# Patient Record
Sex: Female | Born: 1940 | Race: White | Hispanic: No | Smoking: Current every day smoker
Health system: Southern US, Community
[De-identification: ages and names within clinical notes are randomized; demographics above are authoritative.]

## PROBLEM LIST (undated history)

## (undated) DIAGNOSIS — J019 Acute sinusitis, unspecified: Secondary | ICD-10-CM

## (undated) DIAGNOSIS — E669 Obesity, unspecified: Secondary | ICD-10-CM

## (undated) DIAGNOSIS — J449 Chronic obstructive pulmonary disease, unspecified: Secondary | ICD-10-CM

## (undated) DIAGNOSIS — M1711 Unilateral primary osteoarthritis, right knee: Secondary | ICD-10-CM

## (undated) DIAGNOSIS — R918 Other nonspecific abnormal finding of lung field: Secondary | ICD-10-CM

## (undated) DIAGNOSIS — J309 Allergic rhinitis, unspecified: Secondary | ICD-10-CM

## (undated) DIAGNOSIS — Z720 Tobacco use: Secondary | ICD-10-CM

## (undated) DIAGNOSIS — I739 Peripheral vascular disease, unspecified: Secondary | ICD-10-CM

## (undated) DIAGNOSIS — R0600 Dyspnea, unspecified: Secondary | ICD-10-CM

## (undated) DIAGNOSIS — E119 Type 2 diabetes mellitus without complications: Secondary | ICD-10-CM

## (undated) DIAGNOSIS — I509 Heart failure, unspecified: Secondary | ICD-10-CM

## (undated) DIAGNOSIS — M539 Dorsopathy, unspecified: Secondary | ICD-10-CM

## (undated) DIAGNOSIS — E782 Mixed hyperlipidemia: Secondary | ICD-10-CM

## (undated) HISTORY — PX: OTHER SURGICAL HISTORY: SHX169

## (undated) HISTORY — DX: Heart failure, unspecified: I50.9

## (undated) HISTORY — DX: Obesity, unspecified: E66.9

## (undated) HISTORY — DX: Chronic obstructive pulmonary disease, unspecified: J44.9

## (undated) HISTORY — PX: CHOLECYSTECTOMY: SHX55

## (undated) HISTORY — PX: BACK SURGERY: SHX140

## (undated) HISTORY — PX: TONSILLECTOMY: SUR1361

## (undated) HISTORY — PX: EYE SURGERY: SHX253

## (undated) HISTORY — PX: ABDOMINAL HYSTERECTOMY: SHX81

## (undated) HISTORY — PX: NECK SURGERY: SHX720

## (undated) HISTORY — PX: APPENDECTOMY: SHX54

## (undated) HISTORY — DX: Allergic rhinitis, unspecified: J30.9

## (undated) HISTORY — DX: Peripheral vascular disease, unspecified: I73.9

---

## 2002-01-01 ENCOUNTER — Inpatient Hospital Stay (HOSPITAL_COMMUNITY): Admission: EM | Admit: 2002-01-01 | Discharge: 2002-01-08 | Payer: Self-pay | Admitting: Emergency Medicine

## 2002-01-01 ENCOUNTER — Encounter: Payer: Self-pay | Admitting: Emergency Medicine

## 2002-01-04 ENCOUNTER — Encounter: Payer: Self-pay | Admitting: Internal Medicine

## 2014-12-25 DIAGNOSIS — J449 Chronic obstructive pulmonary disease, unspecified: Secondary | ICD-10-CM | POA: Diagnosis not present

## 2014-12-25 DIAGNOSIS — Z720 Tobacco use: Secondary | ICD-10-CM | POA: Diagnosis not present

## 2014-12-25 DIAGNOSIS — L989 Disorder of the skin and subcutaneous tissue, unspecified: Secondary | ICD-10-CM | POA: Diagnosis not present

## 2014-12-25 DIAGNOSIS — I509 Heart failure, unspecified: Secondary | ICD-10-CM | POA: Diagnosis not present

## 2015-01-08 DIAGNOSIS — Z0000 Encounter for general adult medical examination without abnormal findings: Secondary | ICD-10-CM | POA: Diagnosis not present

## 2015-01-08 DIAGNOSIS — J209 Acute bronchitis, unspecified: Secondary | ICD-10-CM | POA: Diagnosis not present

## 2015-01-14 DIAGNOSIS — J449 Chronic obstructive pulmonary disease, unspecified: Secondary | ICD-10-CM | POA: Diagnosis not present

## 2015-02-17 DIAGNOSIS — R509 Fever, unspecified: Secondary | ICD-10-CM | POA: Diagnosis not present

## 2015-03-03 DIAGNOSIS — J449 Chronic obstructive pulmonary disease, unspecified: Secondary | ICD-10-CM | POA: Diagnosis not present

## 2015-03-05 DIAGNOSIS — M1990 Unspecified osteoarthritis, unspecified site: Secondary | ICD-10-CM | POA: Diagnosis not present

## 2015-03-05 DIAGNOSIS — M539 Dorsopathy, unspecified: Secondary | ICD-10-CM | POA: Diagnosis not present

## 2015-03-05 DIAGNOSIS — R0602 Shortness of breath: Secondary | ICD-10-CM | POA: Diagnosis not present

## 2015-03-05 DIAGNOSIS — M25552 Pain in left hip: Secondary | ICD-10-CM | POA: Diagnosis not present

## 2015-03-11 DIAGNOSIS — R0602 Shortness of breath: Secondary | ICD-10-CM | POA: Diagnosis not present

## 2015-04-03 DIAGNOSIS — R5383 Other fatigue: Secondary | ICD-10-CM | POA: Diagnosis not present

## 2015-04-03 DIAGNOSIS — E785 Hyperlipidemia, unspecified: Secondary | ICD-10-CM | POA: Diagnosis not present

## 2015-04-03 DIAGNOSIS — I509 Heart failure, unspecified: Secondary | ICD-10-CM | POA: Diagnosis not present

## 2015-04-03 DIAGNOSIS — K219 Gastro-esophageal reflux disease without esophagitis: Secondary | ICD-10-CM | POA: Diagnosis not present

## 2015-04-03 DIAGNOSIS — Z79899 Other long term (current) drug therapy: Secondary | ICD-10-CM | POA: Diagnosis not present

## 2015-04-03 DIAGNOSIS — M1990 Unspecified osteoarthritis, unspecified site: Secondary | ICD-10-CM | POA: Diagnosis not present

## 2015-04-09 DIAGNOSIS — J449 Chronic obstructive pulmonary disease, unspecified: Secondary | ICD-10-CM | POA: Diagnosis not present

## 2015-04-10 DIAGNOSIS — D510 Vitamin B12 deficiency anemia due to intrinsic factor deficiency: Secondary | ICD-10-CM | POA: Diagnosis not present

## 2015-04-10 DIAGNOSIS — J449 Chronic obstructive pulmonary disease, unspecified: Secondary | ICD-10-CM | POA: Diagnosis not present

## 2015-04-17 DIAGNOSIS — D510 Vitamin B12 deficiency anemia due to intrinsic factor deficiency: Secondary | ICD-10-CM | POA: Diagnosis not present

## 2015-04-23 DIAGNOSIS — M1711 Unilateral primary osteoarthritis, right knee: Secondary | ICD-10-CM | POA: Diagnosis not present

## 2015-05-05 DIAGNOSIS — D510 Vitamin B12 deficiency anemia due to intrinsic factor deficiency: Secondary | ICD-10-CM | POA: Diagnosis not present

## 2015-05-05 DIAGNOSIS — M539 Dorsopathy, unspecified: Secondary | ICD-10-CM | POA: Diagnosis not present

## 2015-05-05 DIAGNOSIS — J302 Other seasonal allergic rhinitis: Secondary | ICD-10-CM | POA: Diagnosis not present

## 2015-05-08 DIAGNOSIS — F17200 Nicotine dependence, unspecified, uncomplicated: Secondary | ICD-10-CM | POA: Diagnosis not present

## 2015-05-08 DIAGNOSIS — R0789 Other chest pain: Secondary | ICD-10-CM | POA: Diagnosis not present

## 2015-05-08 DIAGNOSIS — I509 Heart failure, unspecified: Secondary | ICD-10-CM | POA: Diagnosis not present

## 2015-05-08 DIAGNOSIS — R0602 Shortness of breath: Secondary | ICD-10-CM | POA: Diagnosis not present

## 2015-05-12 DIAGNOSIS — J449 Chronic obstructive pulmonary disease, unspecified: Secondary | ICD-10-CM | POA: Diagnosis not present

## 2015-05-13 DIAGNOSIS — R0602 Shortness of breath: Secondary | ICD-10-CM | POA: Diagnosis not present

## 2015-05-13 DIAGNOSIS — I509 Heart failure, unspecified: Secondary | ICD-10-CM | POA: Diagnosis not present

## 2015-05-13 DIAGNOSIS — R0789 Other chest pain: Secondary | ICD-10-CM | POA: Diagnosis not present

## 2015-05-23 DIAGNOSIS — D510 Vitamin B12 deficiency anemia due to intrinsic factor deficiency: Secondary | ICD-10-CM | POA: Diagnosis not present

## 2015-05-23 DIAGNOSIS — M539 Dorsopathy, unspecified: Secondary | ICD-10-CM | POA: Diagnosis not present

## 2015-05-23 DIAGNOSIS — Z720 Tobacco use: Secondary | ICD-10-CM | POA: Diagnosis not present

## 2015-08-11 DIAGNOSIS — J449 Chronic obstructive pulmonary disease, unspecified: Secondary | ICD-10-CM | POA: Diagnosis not present

## 2015-09-05 DIAGNOSIS — L989 Disorder of the skin and subcutaneous tissue, unspecified: Secondary | ICD-10-CM | POA: Diagnosis not present

## 2015-11-03 DIAGNOSIS — Z79899 Other long term (current) drug therapy: Secondary | ICD-10-CM | POA: Diagnosis not present

## 2015-11-03 DIAGNOSIS — M1990 Unspecified osteoarthritis, unspecified site: Secondary | ICD-10-CM | POA: Diagnosis not present

## 2015-11-03 DIAGNOSIS — Z720 Tobacco use: Secondary | ICD-10-CM | POA: Diagnosis not present

## 2015-11-03 DIAGNOSIS — J329 Chronic sinusitis, unspecified: Secondary | ICD-10-CM | POA: Diagnosis not present

## 2015-11-03 DIAGNOSIS — Z0000 Encounter for general adult medical examination without abnormal findings: Secondary | ICD-10-CM | POA: Diagnosis not present

## 2015-12-10 DIAGNOSIS — J209 Acute bronchitis, unspecified: Secondary | ICD-10-CM | POA: Diagnosis not present

## 2015-12-10 DIAGNOSIS — J441 Chronic obstructive pulmonary disease with (acute) exacerbation: Secondary | ICD-10-CM | POA: Diagnosis not present

## 2016-02-03 DIAGNOSIS — J189 Pneumonia, unspecified organism: Secondary | ICD-10-CM | POA: Diagnosis not present

## 2016-02-03 DIAGNOSIS — Z1389 Encounter for screening for other disorder: Secondary | ICD-10-CM | POA: Diagnosis not present

## 2016-02-03 DIAGNOSIS — Z0000 Encounter for general adult medical examination without abnormal findings: Secondary | ICD-10-CM | POA: Diagnosis not present

## 2016-02-03 DIAGNOSIS — Z79899 Other long term (current) drug therapy: Secondary | ICD-10-CM | POA: Diagnosis not present

## 2016-02-03 DIAGNOSIS — J449 Chronic obstructive pulmonary disease, unspecified: Secondary | ICD-10-CM | POA: Diagnosis not present

## 2016-02-03 DIAGNOSIS — D510 Vitamin B12 deficiency anemia due to intrinsic factor deficiency: Secondary | ICD-10-CM | POA: Diagnosis not present

## 2016-02-27 ENCOUNTER — Ambulatory Visit (INDEPENDENT_AMBULATORY_CARE_PROVIDER_SITE_OTHER)
Admission: RE | Admit: 2016-02-27 | Discharge: 2016-02-27 | Disposition: A | Discharge status: Home / Self Care | Payer: Medicare Other | Source: Ambulatory Visit | Attending: Pulmonary Disease | Admitting: Pulmonary Disease

## 2016-02-27 ENCOUNTER — Ambulatory Visit (INDEPENDENT_AMBULATORY_CARE_PROVIDER_SITE_OTHER): Payer: Medicare Other | Admitting: Pulmonary Disease

## 2016-02-27 ENCOUNTER — Encounter: Payer: Self-pay | Admitting: Pulmonary Disease

## 2016-02-27 VITALS — BP 138/72 | HR 84 | Ht 65.5 in | Wt 219.4 lb

## 2016-02-27 DIAGNOSIS — J449 Chronic obstructive pulmonary disease, unspecified: Secondary | ICD-10-CM

## 2016-02-27 DIAGNOSIS — F17210 Nicotine dependence, cigarettes, uncomplicated: Secondary | ICD-10-CM

## 2016-02-27 NOTE — Progress Notes (Addendum)
Jeanette Simpson    [id number]    10-22-1940  Primary Care Physician:Simpson Jeanette Hart., MD  Referring Physician: Noni Saupe, MD [address] [place], [place] [address]  Chief complaint: Consult for evaluation of COPD GOLD D  HPI: [name] is a 76 year old with past medical history of COPD GOLD D (CAT score 10, 3-4 exacerbations every year). She was evaluated in [facility] in 2014 for severe COPD, active smoking and she was on Symbicort, PRN inhalers and DuoNeb's.  She reports 3-4 exacerbations every year. She was seen at her primary care office in mid January for another exacerbation, respiratory tract infection. She was treated with Levaquin and medication with improvement in symptoms. She reportedly had a chest x-ray at that time which showed an abnormality. She was told to follow-up at pulmonary for consideration of CT scan. I do not have the image or report to review.   She has chronic dyspnea on exertion, cough with sputum production. She denies any fevers, chills, hemoptysis, chest pain, palpitation. She has a 30-pack-year smoking history and continues to smoke half pack per day.  Outpatient Encounter Prescriptions as of 02/27/2016  Medication Sig  . benzonatate (TESSALON) 200 MG capsule TK 1 C PO TID PRF COUGH  . budesonide-formoterol (SYMBICORT) 160-4.5 MCG/ACT inhaler Inhale 2 puffs into the lungs 2 (two) times daily.  . DULoxetine (CYMBALTA) 60 MG capsule   . ipratropium-albuterol (DUONEB) 0.5-2.5 (3) MG/3ML SOLN   . omeprazole (PRILOSEC) 20 MG capsule Take 20 mg by mouth.  . spironolactone (ALDACTONE) 25 MG tablet TK 1 T PO QAM  . traMADol (ULTRAM) 50 MG tablet Take 50 mg by mouth.  . [DISCONTINUED] budesonide (PULMICORT) 0.5 MG/2ML nebulizer solution 0.5 mg.   No facility-administered encounter medications on file as of 02/27/2016.     Allergies as of 02/27/2016 - never reviewed  Allergen Reaction Noted  . Penicillins Hives and Swelling 01/04/2013  .  Codeine Nausea Only 01/04/2013  . Sulfa antibiotics Nausea And Vomiting 01/04/2013    Past Medical History:  Diagnosis Date  . Allergic rhinitis   . CHF (congestive heart failure) (HCC)   . COPD (chronic obstructive pulmonary disease) (HCC)   . Obesity   . PVD (peripheral vascular disease) (HCC)     Past Surgical History:  Procedure Laterality Date  . APPENDECTOMY    . BACK SURGERY    . CHOLECYSTECTOMY    . HYSTERCTOMY    . NECK SURGERY    . TONSILLECTOMY      Family History  Problem Relation Age of Onset  . Heart attack Mother   . Cancer Father     Social History   Social History  . Marital status: Widowed    Spouse name: N/A  . Number of children: N/A  . Years of education: N/A   Occupational History  . Not on file.   Social History Main Topics  . Smoking status: Current Every Day Smoker    Packs/day: 0.50    Years: 60.00    Types: Cigarettes  . Smokeless tobacco: Never Used  . Alcohol use No  . Drug use: No  . Sexual activity: No   Other Topics Concern  . Not on file   Social History Narrative  . No narrative on file    Review of systems: Review of Systems  Constitutional: Negative for fever and chills.  HENT: Negative.   Eyes: Negative for blurred vision.  Respiratory: as per  HPI  Cardiovascular: Negative for chest pain and palpitations.  Gastrointestinal: Negative for vomiting, diarrhea, blood per rectum. Genitourinary: Negative for dysuria, urgency, frequency and hematuria.  Musculoskeletal: Negative for myalgias, back pain and joint pain.  Skin: Negative for itching and rash.  Neurological: Negative for dizziness, tremors, focal weakness, seizures and loss of consciousness.  Endo/Heme/Allergies: Negative for environmental allergies.  Psychiatric/Behavioral: Negative for depression, suicidal ideas and hallucinations.  All other systems reviewed and are negative.  Physical Exam: Blood pressure 138/72, pulse 84, height 5' 5.5" (1.664 m),  weight 219 lb 6.4 oz (99.5 kg), SpO2 92 %. Gen:      No acute distress HEENT:  EOMI, sclera anicteric Neck:     No masses; no thyromegaly Lungs:    Clear to auscultation bilaterally; normal respiratory effort CV:         Regular rate and rhythm; no murmurs Abd:      + bowel sounds; soft, non-tender; no palpable masses, no distension Ext:    No edema; adequate peripheral perfusion Skin:      Warm and dry; no rash Neuro: alert and oriented x 3 Psych: normal mood and affect  Data Reviewed[facility] 40% VC 49% ratio =62% FEF25-75 25% strong bronchodilator response lung volmes TLC 74%, RV 103% DLCO 67% Interpretation: severe obstruction no gas trapping or hyperinflation, mildly reduced diffusion.  Chest x-ray 06/07/04-no acute abnormality. I personally reviewed the image.  Assessment:  COPD GOLD D She appears to have recovered from her recent exacerbation. She continues on Symbicort but reports that she does not use it on a regular basis. I have asked her to optimize her inhalers by using it twice daily. She may need addition of LAMA. We will assess this at next visit. I'll get records from her primary care including recent spirometry.  Abnormal CXR She reportedly had an abnormal chest x-ray at the primary care office last month. I'll try to get these records for review. She'll be scheduled for repeat x-ray  Active smoker She is still smoking half pack per day. Smoking cessation discussed today but she is not ready to quit yet. Time spent counseling-5 minutes  Plan/Recommendations: - Continue symbicort, albuterol PRN - CXR - Records from PCP - Smoking cessation  Chilton GreathousePraveen Auriah Hollings MD [facility] Pager [phone] 02/27/2016, 11:35 AM  CC: Jeanette Saupeedding, Jeanette F. II, MD   Addendum: Reviewed fax from primary care office  Spirometry 04/10/15 FVC 1.43 [53%] FEV1 0.83 [41%] F/F 68 Severe obstructive airway disease  No chest x-ray on record.

## 2016-02-27 NOTE — Patient Instructions (Signed)
Continue using the Symbicort and albuterol as prescribed We will get a chest x-ray today. I'll try to obtain previous records from his primary care physician.  Return to clinic in 1-2 months.

## 2016-04-12 ENCOUNTER — Ambulatory Visit: Payer: Medicare Other | Admitting: Pulmonary Disease

## 2016-06-08 DIAGNOSIS — J449 Chronic obstructive pulmonary disease, unspecified: Secondary | ICD-10-CM | POA: Diagnosis not present

## 2016-06-08 DIAGNOSIS — M1990 Unspecified osteoarthritis, unspecified site: Secondary | ICD-10-CM | POA: Diagnosis not present

## 2016-06-08 DIAGNOSIS — Z720 Tobacco use: Secondary | ICD-10-CM | POA: Diagnosis not present

## 2016-06-09 DIAGNOSIS — M1711 Unilateral primary osteoarthritis, right knee: Secondary | ICD-10-CM | POA: Diagnosis not present

## 2016-10-05 DIAGNOSIS — M1711 Unilateral primary osteoarthritis, right knee: Secondary | ICD-10-CM | POA: Diagnosis not present

## 2016-11-10 DIAGNOSIS — J019 Acute sinusitis, unspecified: Secondary | ICD-10-CM | POA: Diagnosis not present

## 2016-11-10 DIAGNOSIS — Z720 Tobacco use: Secondary | ICD-10-CM | POA: Diagnosis not present

## 2016-11-10 DIAGNOSIS — B9689 Other specified bacterial agents as the cause of diseases classified elsewhere: Secondary | ICD-10-CM | POA: Diagnosis not present

## 2016-12-19 DIAGNOSIS — R069 Unspecified abnormalities of breathing: Secondary | ICD-10-CM | POA: Diagnosis not present

## 2016-12-19 DIAGNOSIS — J441 Chronic obstructive pulmonary disease with (acute) exacerbation: Secondary | ICD-10-CM | POA: Diagnosis not present

## 2016-12-19 DIAGNOSIS — R0602 Shortness of breath: Secondary | ICD-10-CM | POA: Diagnosis not present

## 2016-12-23 DIAGNOSIS — Z720 Tobacco use: Secondary | ICD-10-CM | POA: Diagnosis not present

## 2016-12-23 DIAGNOSIS — J441 Chronic obstructive pulmonary disease with (acute) exacerbation: Secondary | ICD-10-CM | POA: Diagnosis not present

## 2016-12-30 DIAGNOSIS — J449 Chronic obstructive pulmonary disease, unspecified: Secondary | ICD-10-CM | POA: Diagnosis not present

## 2017-01-05 DIAGNOSIS — M1711 Unilateral primary osteoarthritis, right knee: Secondary | ICD-10-CM | POA: Diagnosis not present

## 2017-01-30 DIAGNOSIS — J449 Chronic obstructive pulmonary disease, unspecified: Secondary | ICD-10-CM | POA: Diagnosis not present

## 2017-02-25 DIAGNOSIS — M1990 Unspecified osteoarthritis, unspecified site: Secondary | ICD-10-CM | POA: Diagnosis not present

## 2017-02-25 DIAGNOSIS — Z79899 Other long term (current) drug therapy: Secondary | ICD-10-CM | POA: Diagnosis not present

## 2017-02-25 DIAGNOSIS — J449 Chronic obstructive pulmonary disease, unspecified: Secondary | ICD-10-CM | POA: Diagnosis not present

## 2017-02-25 DIAGNOSIS — I509 Heart failure, unspecified: Secondary | ICD-10-CM | POA: Diagnosis not present

## 2017-02-25 DIAGNOSIS — M539 Dorsopathy, unspecified: Secondary | ICD-10-CM | POA: Diagnosis not present

## 2017-03-02 DIAGNOSIS — J449 Chronic obstructive pulmonary disease, unspecified: Secondary | ICD-10-CM | POA: Diagnosis not present

## 2017-03-30 DIAGNOSIS — J449 Chronic obstructive pulmonary disease, unspecified: Secondary | ICD-10-CM | POA: Diagnosis not present

## 2017-04-05 DIAGNOSIS — M1711 Unilateral primary osteoarthritis, right knee: Secondary | ICD-10-CM | POA: Diagnosis not present

## 2017-04-28 DIAGNOSIS — E119 Type 2 diabetes mellitus without complications: Secondary | ICD-10-CM | POA: Diagnosis not present

## 2017-04-28 DIAGNOSIS — I509 Heart failure, unspecified: Secondary | ICD-10-CM | POA: Diagnosis not present

## 2017-04-28 DIAGNOSIS — J449 Chronic obstructive pulmonary disease, unspecified: Secondary | ICD-10-CM | POA: Diagnosis not present

## 2017-04-28 DIAGNOSIS — R600 Localized edema: Secondary | ICD-10-CM | POA: Diagnosis not present

## 2017-04-28 DIAGNOSIS — L03115 Cellulitis of right lower limb: Secondary | ICD-10-CM | POA: Diagnosis not present

## 2017-04-28 DIAGNOSIS — Z720 Tobacco use: Secondary | ICD-10-CM | POA: Diagnosis not present

## 2017-04-30 DIAGNOSIS — J449 Chronic obstructive pulmonary disease, unspecified: Secondary | ICD-10-CM | POA: Diagnosis not present

## 2017-05-09 DIAGNOSIS — J449 Chronic obstructive pulmonary disease, unspecified: Secondary | ICD-10-CM | POA: Diagnosis not present

## 2017-05-09 DIAGNOSIS — E119 Type 2 diabetes mellitus without complications: Secondary | ICD-10-CM | POA: Diagnosis not present

## 2017-05-09 DIAGNOSIS — E782 Mixed hyperlipidemia: Secondary | ICD-10-CM | POA: Diagnosis not present

## 2017-05-09 DIAGNOSIS — Z0000 Encounter for general adult medical examination without abnormal findings: Secondary | ICD-10-CM | POA: Diagnosis not present

## 2017-05-09 DIAGNOSIS — R600 Localized edema: Secondary | ICD-10-CM | POA: Diagnosis not present

## 2017-05-30 DIAGNOSIS — J449 Chronic obstructive pulmonary disease, unspecified: Secondary | ICD-10-CM | POA: Diagnosis not present

## 2017-06-28 IMAGING — DX DG CHEST 2V
2 series · 2 of 2 positions shown · non-contrast
Comparison: None.

CLINICAL DATA: COPD.

EXAM:
CHEST  2 VIEW

[chest pa]
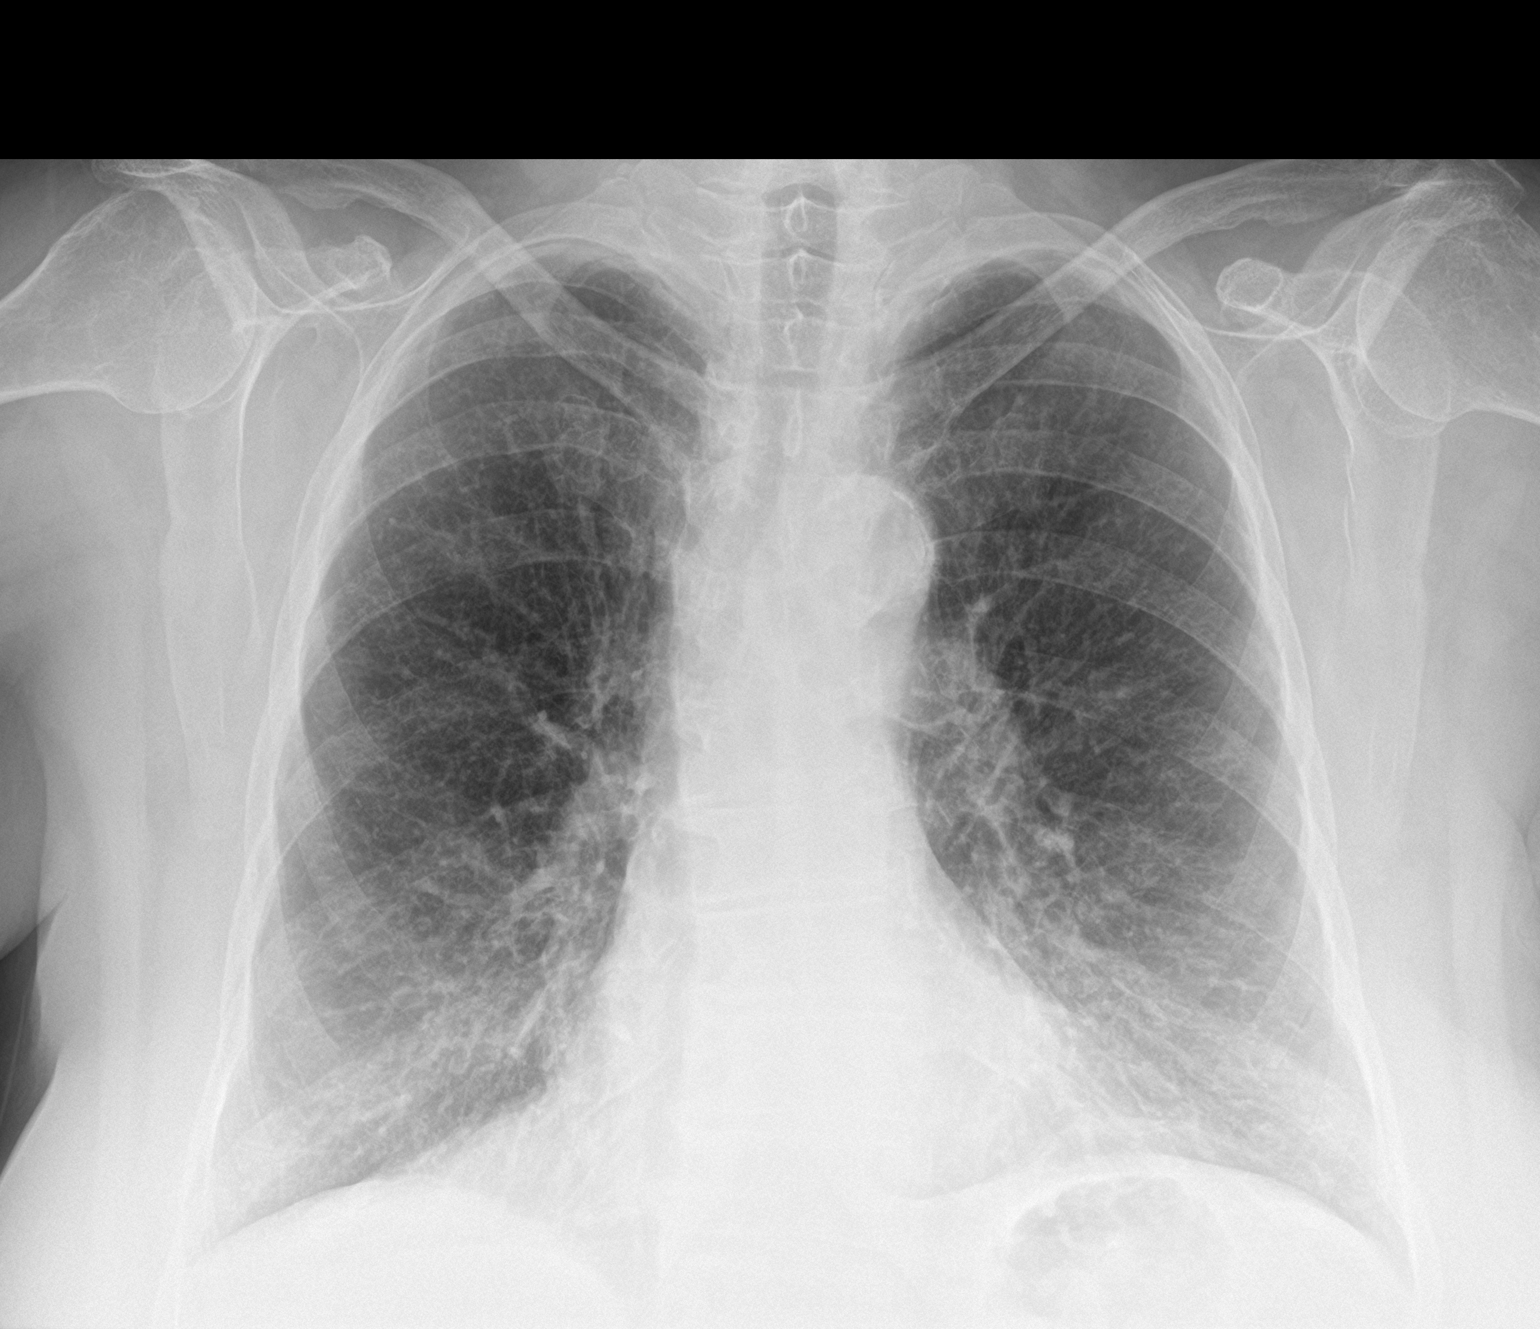

[chest lat]
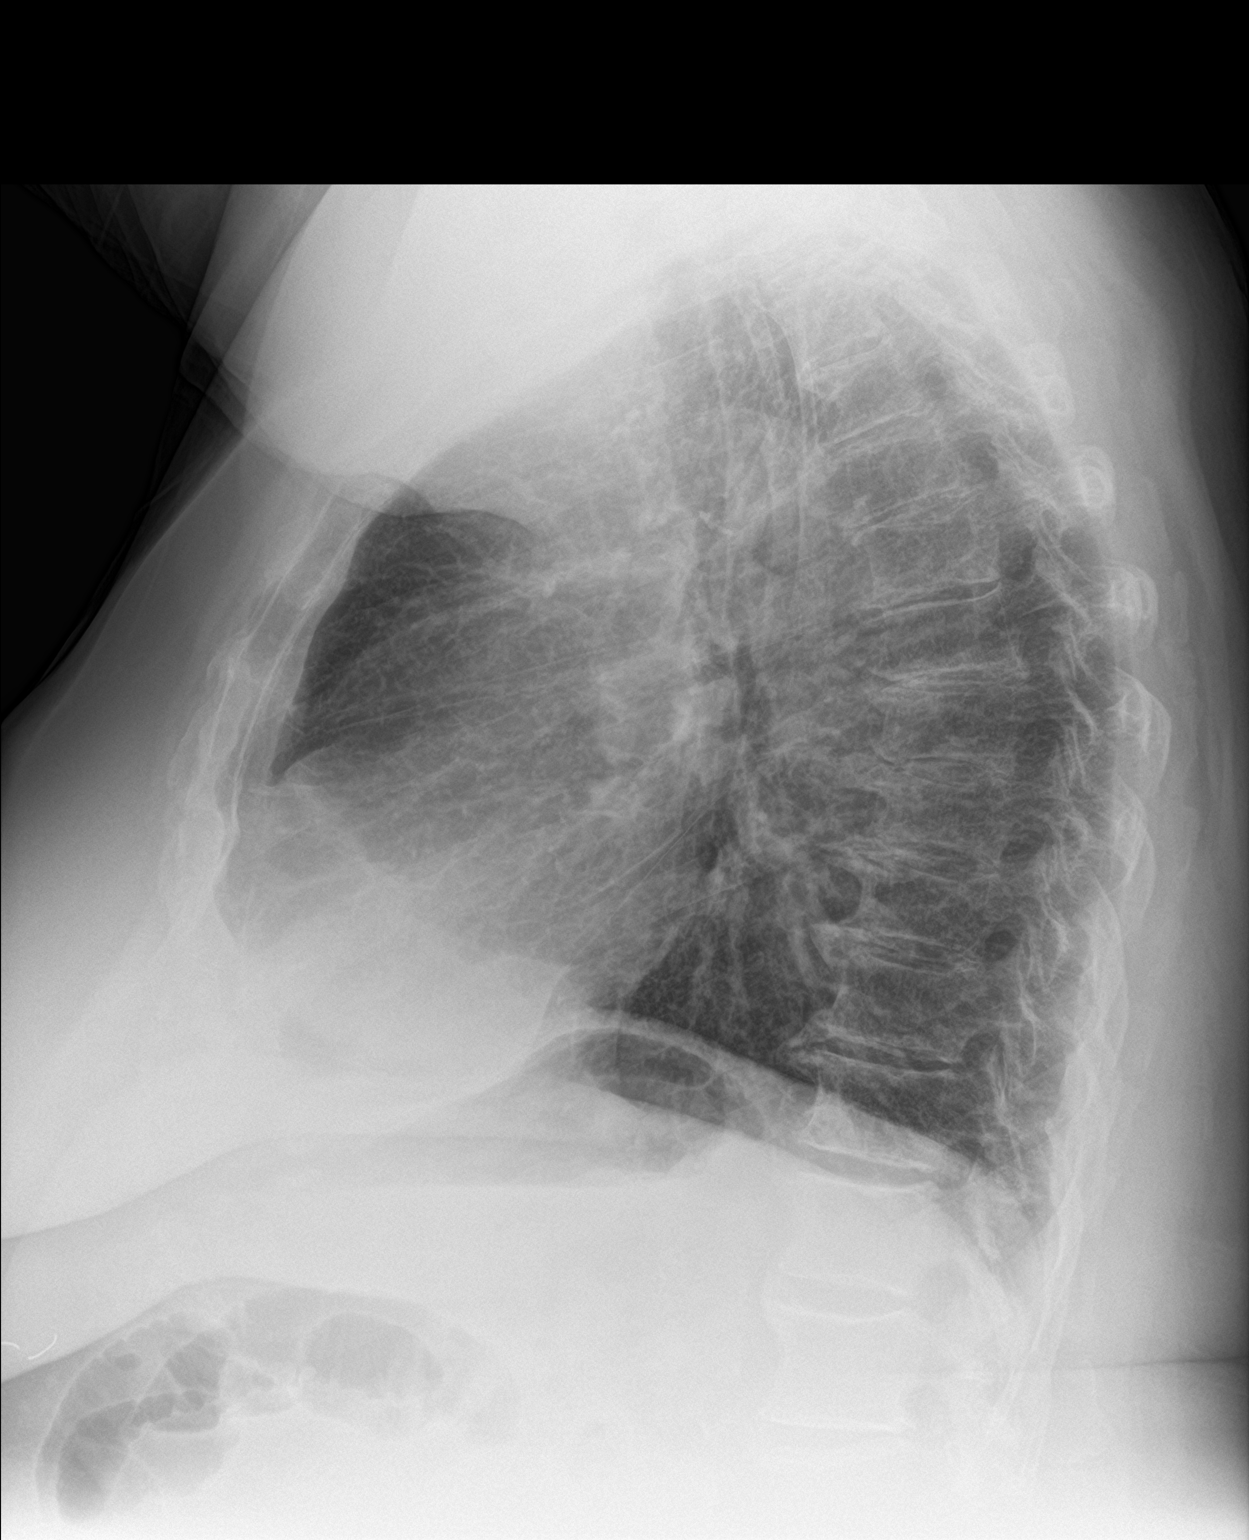

[2 of 2 positions shown; findings below may reference images not displayed]

FINDINGS: There is hyperinflation of the lungs compatible with COPD. Heart and
mediastinal contours are within normal limits. No focal opacities or
effusions. No acute bony abnormality.
IMPRESSION: COPD.  No active disease.

## 2017-06-30 DIAGNOSIS — J449 Chronic obstructive pulmonary disease, unspecified: Secondary | ICD-10-CM | POA: Diagnosis not present

## 2017-07-06 DIAGNOSIS — S5001XA Contusion of right elbow, initial encounter: Secondary | ICD-10-CM | POA: Diagnosis not present

## 2017-07-06 DIAGNOSIS — M1711 Unilateral primary osteoarthritis, right knee: Secondary | ICD-10-CM | POA: Diagnosis not present

## 2017-07-19 DIAGNOSIS — E782 Mixed hyperlipidemia: Secondary | ICD-10-CM | POA: Diagnosis not present

## 2017-07-19 DIAGNOSIS — E119 Type 2 diabetes mellitus without complications: Secondary | ICD-10-CM | POA: Diagnosis not present

## 2017-07-19 DIAGNOSIS — M539 Dorsopathy, unspecified: Secondary | ICD-10-CM | POA: Diagnosis not present

## 2017-07-19 DIAGNOSIS — J449 Chronic obstructive pulmonary disease, unspecified: Secondary | ICD-10-CM | POA: Diagnosis not present

## 2017-07-19 DIAGNOSIS — Z720 Tobacco use: Secondary | ICD-10-CM | POA: Diagnosis not present

## 2017-08-30 ENCOUNTER — Other Ambulatory Visit: Payer: Self-pay

## 2017-08-30 DIAGNOSIS — J449 Chronic obstructive pulmonary disease, unspecified: Secondary | ICD-10-CM | POA: Diagnosis not present

## 2017-08-30 NOTE — Patient Outreach (Signed)
[facility]) Care Management  08/30/2017  Jeanette Simpson [birth year] [id number]   Medication Adherence call to Mrs. Jeanette Simpson spoke with patient she  is due for her pravastatin 40 mg and has already call [facility] and have order this medication. Jeanette Simpson is showing past [facility]   Lillia AbedAna Ollison-Moran CPhT Pharmacy Technician [facility] Care Management Direct Dial [phone]  Fax [phone] [email or web address]

## 2017-09-30 DIAGNOSIS — J449 Chronic obstructive pulmonary disease, unspecified: Secondary | ICD-10-CM | POA: Diagnosis not present

## 2017-10-11 DIAGNOSIS — M1711 Unilateral primary osteoarthritis, right knee: Secondary | ICD-10-CM | POA: Diagnosis not present

## 2017-10-17 ENCOUNTER — Other Ambulatory Visit: Payer: Self-pay

## 2017-10-17 NOTE — Patient Outreach (Signed)
[facility]) Care Management  10/17/2017  WING GFELLER May 02, 1940 [id number]   Medication Adherence call to Mrs. Letitia Libra patient did not answer patient is due on Pravastatin 40 mg. Mrs. Morrow is showing past due under [facility] ins.  Lillia Abed CPhT Pharmacy Technician [facility] Management Direct Dial [phone]  Fax [phone] [email or web address]

## 2017-10-30 DIAGNOSIS — J449 Chronic obstructive pulmonary disease, unspecified: Secondary | ICD-10-CM | POA: Diagnosis not present

## 2017-11-22 DIAGNOSIS — M1711 Unilateral primary osteoarthritis, right knee: Secondary | ICD-10-CM | POA: Diagnosis not present

## 2017-11-30 DIAGNOSIS — J449 Chronic obstructive pulmonary disease, unspecified: Secondary | ICD-10-CM | POA: Diagnosis not present

## 2017-12-31 ENCOUNTER — Other Ambulatory Visit: Payer: Self-pay | Admitting: Emergency Medicine

## 2017-12-31 ENCOUNTER — Emergency Department (HOSPITAL_COMMUNITY): Payer: Medicare Other

## 2017-12-31 ENCOUNTER — Other Ambulatory Visit: Payer: Self-pay

## 2017-12-31 ENCOUNTER — Emergency Department (HOSPITAL_COMMUNITY)
Admission: EM | Admit: 2017-12-31 | Discharge: 2017-12-31 | Disposition: A | Discharge status: Home / Self Care | Payer: Medicare Other | Attending: Emergency Medicine | Admitting: Emergency Medicine

## 2017-12-31 DIAGNOSIS — F17210 Nicotine dependence, cigarettes, uncomplicated: Secondary | ICD-10-CM | POA: Insufficient documentation

## 2017-12-31 DIAGNOSIS — R609 Edema, unspecified: Secondary | ICD-10-CM

## 2017-12-31 DIAGNOSIS — Z79899 Other long term (current) drug therapy: Secondary | ICD-10-CM | POA: Diagnosis not present

## 2017-12-31 DIAGNOSIS — R600 Localized edema: Secondary | ICD-10-CM | POA: Diagnosis not present

## 2017-12-31 DIAGNOSIS — I509 Heart failure, unspecified: Secondary | ICD-10-CM | POA: Diagnosis not present

## 2017-12-31 DIAGNOSIS — J441 Chronic obstructive pulmonary disease with (acute) exacerbation: Secondary | ICD-10-CM | POA: Diagnosis not present

## 2017-12-31 DIAGNOSIS — R05 Cough: Secondary | ICD-10-CM | POA: Diagnosis not present

## 2017-12-31 DIAGNOSIS — R062 Wheezing: Secondary | ICD-10-CM | POA: Diagnosis present

## 2017-12-31 LAB — COMPREHENSIVE METABOLIC PANEL
ALT: 19 U/L (ref 0–44)
ANION GAP: 12 (ref 5–15)
AST: 21 U/L (ref 15–41)
Albumin: 3.3 g/dL — ABNORMAL LOW (ref 3.5–5.0)
Alkaline Phosphatase: 74 U/L (ref 38–126)
BUN: 10 mg/dL (ref 8–23)
CO2: 23 mmol/L (ref 22–32)
Calcium: 9.2 mg/dL (ref 8.9–10.3)
Chloride: 102 mmol/L (ref 98–111)
Creatinine, Ser: 0.62 mg/dL (ref 0.44–1.00)
GFR calc Af Amer: >60 mL/min (ref >60)
GFR calc non Af Amer: >60 mL/min (ref >60)
Glucose, Bld: 118 mg/dL — ABNORMAL HIGH (ref 70–99)
Potassium: 4.1 mmol/L (ref 3.5–5.1)
Sodium: 137 mmol/L (ref 135–145)
TOTAL PROTEIN: 6.7 g/dL (ref 6.5–8.1)
Total Bilirubin: 0.4 mg/dL (ref 0.3–1.2)

## 2017-12-31 LAB — CBC WITH DIFFERENTIAL/PLATELET
Abs Immature Granulocytes: 0.09 10*3/uL — ABNORMAL HIGH (ref 0.00–0.07)
Basophils Absolute: 0.1 10*3/uL (ref 0.0–0.1)
Basophils Relative: 0 %
EOS PCT: 1 %
Eosinophils Absolute: 0.2 10*3/uL (ref 0.0–0.5)
HCT: 46.4 % — ABNORMAL HIGH (ref 36.0–46.0)
Hemoglobin: 13.9 g/dL (ref 12.0–15.0)
Immature Granulocytes: 1 %
Lymphocytes Relative: 19 %
Lymphs Abs: 2.7 10*3/uL (ref 0.7–4.0)
MCH: 27.4 pg (ref 26.0–34.0)
MCHC: 30 g/dL (ref 30.0–36.0)
MCV: 91.5 fL (ref 80.0–100.0)
Monocytes Absolute: 0.8 10*3/uL (ref 0.1–1.0)
Monocytes Relative: 6 %
Neutro Abs: 10.2 10*3/uL — ABNORMAL HIGH (ref 1.7–7.7)
Neutrophils Relative %: 73 %
Platelets: 322 10*3/uL (ref 150–400)
RBC: 5.07 MIL/uL (ref 3.87–5.11)
RDW: 15.7 % — ABNORMAL HIGH (ref 11.5–15.5)
WBC: 14 10*3/uL — ABNORMAL HIGH (ref 4.0–10.5)
nRBC: 0 % (ref 0.0–0.2)

## 2017-12-31 LAB — I-STAT CG4 LACTIC ACID, ED: LACTIC ACID, VENOUS: 1.96 mmol/L — AB (ref 0.5–1.9)

## 2017-12-31 LAB — BRAIN NATRIURETIC PEPTIDE: B Natriuretic Peptide: 68.5 pg/mL (ref 0.0–100.0)

## 2017-12-31 MED ORDER — DOXYCYCLINE HYCLATE 100 MG PO CAPS: 100.0000 mg | ORAL_CAPSULE | Freq: Two times a day (BID) | ORAL | 0 refills | Status: DC

## 2017-12-31 MED ORDER — BENZONATATE 100 MG PO CAPS
200.0000 mg | ORAL_CAPSULE | Freq: Once | ORAL | Status: AC
  Administered 2017-12-31: 200 mg via ORAL
  Filled 2017-12-31: qty 2

## 2017-12-31 MED ORDER — FUROSEMIDE 20 MG PO TABS: 20.0000 mg | ORAL_TABLET | Freq: Every day | ORAL | 0 refills | Status: AC

## 2017-12-31 NOTE — Discharge Instructions (Signed)
Please use doxycycline twice a day for 10 days Lasix 20 mg daily for 10 days Albuterol or DuoNeb treatments every 6 hours as needed for shortness of breath wheezing or coughing Seek medical exam for severe or worsening symptoms Follow-up with your doctor within 3 or 4 days for a recheck without fail

## 2017-12-31 NOTE — ED Notes (Signed)
Patient transported to X-ray 

## 2017-12-31 NOTE — ED Notes (Signed)
Patient verbalizes understanding of discharge instructions. Opportunity for questioning and answers were provided. Armband removed by staff, pt discharged from ED with daughter.  

## 2017-12-31 NOTE — ED Triage Notes (Signed)
Pt presents to ED with abdominal and leg swelling with shob. States she's supposed to take a fluid pill every day but cannot tolerate it so she takes it every other day. Also states she has right knee pain she needs surgery for.

## 2017-12-31 NOTE — ED Provider Notes (Signed)
[facility] EMERGENCY DEPARTMENT Provider Note   CSN: [id number] Arrival date & time: 12/31/17  1235     History   Chief Complaint No chief complaint on file.   HPI Jeanette Simpson is a 77 y.o. female.  HPI  The patient is a 77 year old female, she is known to be treated for COPD, there is also report of congestive heart failure though she denies any cardiac history.  She reports that she was recently diagnosed as a diabetic and has started taking metformin twice a day.  She currently lives by herself and has noted some increasing coughing over the week with some occasional sputum but also complains of bilateral lower extremity swelling.  She denies chest pain, she denies being short of breath and denies any fevers or chills.  Symptoms are persistent, gradually worsening, she is brought in by her daughter, she did not want to come to the hospital but the daughter made her come.  Past Medical History:  Diagnosis Date  . Allergic rhinitis   . CHF (congestive heart failure) (HCC)   . COPD (chronic obstructive pulmonary disease) (HCC)   . Obesity   . PVD (peripheral vascular disease) (HCC)     There are no active problems to display for this patient.  PSH:  Non contributory  SH:  Tobacco use daily   OB History   No obstetric history on file.      Home Medications    Prior to Admission medications   Medication Sig Start Date End Date Taking? Authorizing Provider  benzonatate (TESSALON) 200 MG capsule TK 1 C PO TID PRF COUGH 02/03/16   [provider]  budesonide-formoterol (SYMBICORT) 160-4.5 MCG/ACT inhaler Inhale 2 puffs into the lungs 2 (two) times daily.    [provider]  doxycycline (VIBRAMYCIN) 100 MG capsule Take 1 capsule (100 mg total) by mouth 2 (two) times daily. 12/31/17   Eber Hong, MD  DULoxetine (CYMBALTA) 60 MG capsule  02/20/16   [provider]  furosemide (LASIX) 20 MG tablet Take 1 tablet (20 mg total)  by mouth daily for 10 days. 12/31/17 01/10/18  Eber Hong, MD  ipratropium-albuterol (DUONEB) 0.5-2.5 (3) MG/3ML SOLN  02/20/16   [provider]  omeprazole (PRILOSEC) 20 MG capsule Take 20 mg by mouth.    [provider]  spironolactone (ALDACTONE) 25 MG tablet TK 1 T PO QAM 04/03/15   [provider]  traMADol (ULTRAM) 50 MG tablet Take 50 mg by mouth.    [provider]    Family History Family History  Problem Relation Age of Onset  . Heart attack Mother   . Cancer Father     Social History Social History   Tobacco Use  . Smoking status: Current Every Day Smoker    Packs/day: 0.50    Years: 60.00    Pack years: 30.00    Types: Cigarettes  . Smokeless tobacco: Never Used  Substance Use Topics  . Alcohol use: No  . Drug use: No     Allergies   Penicillins; Codeine; and Sulfa antibiotics   Review of Systems Review of Systems  All other systems reviewed and are negative.    Physical Exam Updated Vital Signs BP (!) 144/69 (BP Location: Right Arm)   Pulse 72   Temp 98 F (36.7 C) (Oral)   Resp 16   Ht 1.676 m (5\' 6" )   Wt 88.5 kg   SpO2 95%   BMI 31.47 kg/m  Physical Exam Vitals signs and nursing note reviewed.  Constitutional:      General: She is not in acute distress.    Appearance: She is well-developed.  HENT:     Head: Normocephalic and atraumatic.     Mouth/Throat:     Pharynx: No oropharyngeal exudate.  Eyes:     General: No scleral icterus.       Right eye: No discharge.        Left eye: No discharge.     Conjunctiva/sclera: Conjunctivae normal.     Pupils: Pupils are equal, round, and reactive to light.  Neck:     Musculoskeletal: Normal range of motion and neck supple.     Thyroid: No thyromegaly.     Vascular: No JVD.  Cardiovascular:     Rate and Rhythm: Normal rate and regular rhythm.     Heart sounds: Normal heart sounds. No murmur. No friction rub. No gallop.   Pulmonary:     Effort:  Pulmonary effort is normal. No respiratory distress.     Breath sounds: Wheezing present. No rales.  Abdominal:     General: Bowel sounds are normal. There is no distension.     Palpations: Abdomen is soft. There is no mass.     Tenderness: There is no abdominal tenderness.  Musculoskeletal: Normal range of motion.        General: No tenderness.     Right lower leg: Edema present.     Left lower leg: Edema present.  Lymphadenopathy:     Cervical: No cervical adenopathy.  Skin:    General: Skin is warm and dry.     Findings: No erythema or rash.  Neurological:     Mental Status: She is alert.     Coordination: Coordination normal.  Psychiatric:        Behavior: Behavior normal.      ED Treatments / Results  Labs (all labs ordered are listed, but only abnormal results are displayed) Labs Reviewed  CBC WITH DIFFERENTIAL/PLATELET - Abnormal; Notable for the following components:      Result Value   WBC 14.0 (*)    HCT 46.4 (*)    RDW 15.7 (*)    Neutro Abs 10.2 (*)    Abs Immature Granulocytes 0.09 (*)    All other components within normal limits  COMPREHENSIVE METABOLIC PANEL - Abnormal; Notable for the following components:   Glucose, Bld 118 (*)    Albumin 3.3 (*)    All other components within normal limits  I-STAT CG4 LACTIC ACID, ED - Abnormal; Notable for the following components:   Lactic Acid, Venous 1.96 (*)    All other components within normal limits  BRAIN NATRIURETIC PEPTIDE  I-STAT CG4 LACTIC ACID, ED    EKG EKG Interpretation  Date/Time:  Saturday December 31 2017 12:45:27 EST Ventricular Rate:  81 PR Interval:    QRS Duration: 113 QT Interval:  375 QTC Calculation: 436 R Axis:   -12 Text Interpretation:  Sinus rhythm Anteroseptal infarct, old No old tracing to compare Confirmed by Eber Hong ([id number]) on 12/31/2017 1:29:25 PM   Radiology Dg Chest 2 View  Result Date: 12/31/2017 CLINICAL DATA:  Cough for a couple months. History of COPD and  congestive heart failure. EXAM: CHEST - 2 VIEW COMPARISON:  12/19/2016 and 06/07/2004. FINDINGS: Lordotic positioning on the AP view. The heart size and mediastinal contours are stable. There is aortic atherosclerosis. There is mild chronic central airway thickening without confluent airspace opacity,  pleural effusion or pneumothorax. No acute osseous findings are seen. Telemetry leads overlie the chest. IMPRESSION: Stable chest without evidence of acute cardiopulmonary process. Mild chronic central airway thickening consistent with bronchitis. Electronically Signed   By: Carey BullocksWilliam  Veazey M.D.   On: 12/31/2017 14:39    Procedures Procedures (including critical care time)  Medications Ordered in ED Medications  benzonatate (TESSALON) capsule 200 mg (200 mg Oral Given 12/31/17 1339)     Initial Impression / Assessment and Plan / ED Course  I have reviewed the triage vital signs and the nursing notes.  Pertinent labs & imaging results that were available during my care of the patient were reviewed by me and considered in my medical decision making (see chart for details).  Clinical Course as of Jan 01 1520  Sat Dec 31, 2017  1513 Labs show a leukocytosis but no signs of pneumonia, metabolic panel is unremarkable, BNP liver function and renal function okay, lactic acid less than 2.  The patient is stable vital signs, I will place her on an antibiotic and a small amount of Lasix and she can follow-up in the outpatient setting.  Patient agreeable.   [BM]    Clinical Course User Index [BM] Eber HongMiller, Jacquelinne Speak, MD    The patient does have a couple of abnormal findings on exam, she has some wheezing which is not surprising given her chronic tobacco use history of smoking at least 1 pack/day, she has some sputum production suggesting a bronchitis or pneumonia and has had bilateral lower extremity swelling.  This confuses the clinical picture as this could easily relate to congestive heart failure causing  pulmonary edema and swelling, labs and x-ray have been ordered, the patient is otherwise stable appearing with no hypotension tachycardia or fever.  The patient has on repeat exam stable overall unremarkable I have reviewed all of her labs with her including her slight leukocytosis her normal metabolic panel including liver function renal function and BNP.  The patient will be prescribed doxycycline and Lasix for the next 10 days, follow-up in the office, she is totally agreeable.  Final Clinical Impressions(s) / ED Diagnoses   Final diagnoses:  COPD exacerbation (HCC)  Peripheral edema    ED Discharge Orders         Ordered    doxycycline (VIBRAMYCIN) 100 MG capsule  2 times daily     12/31/17 1518    furosemide (LASIX) 20 MG tablet  Daily     12/31/17 1518           Eber HongMiller, Juel Ripley, MD 12/31/17 1521

## 2018-01-03 DIAGNOSIS — M539 Dorsopathy, unspecified: Secondary | ICD-10-CM | POA: Diagnosis not present

## 2018-01-03 DIAGNOSIS — I509 Heart failure, unspecified: Secondary | ICD-10-CM | POA: Diagnosis not present

## 2018-01-03 DIAGNOSIS — E119 Type 2 diabetes mellitus without complications: Secondary | ICD-10-CM | POA: Diagnosis not present

## 2018-01-03 DIAGNOSIS — R600 Localized edema: Secondary | ICD-10-CM | POA: Diagnosis not present

## 2018-01-03 DIAGNOSIS — J441 Chronic obstructive pulmonary disease with (acute) exacerbation: Secondary | ICD-10-CM | POA: Diagnosis not present

## 2018-01-05 DIAGNOSIS — M1711 Unilateral primary osteoarthritis, right knee: Secondary | ICD-10-CM | POA: Diagnosis not present

## 2018-01-12 DIAGNOSIS — M1990 Unspecified osteoarthritis, unspecified site: Secondary | ICD-10-CM | POA: Diagnosis not present

## 2018-01-12 DIAGNOSIS — M539 Dorsopathy, unspecified: Secondary | ICD-10-CM | POA: Diagnosis not present

## 2018-01-12 DIAGNOSIS — J449 Chronic obstructive pulmonary disease, unspecified: Secondary | ICD-10-CM | POA: Diagnosis not present

## 2018-01-30 DIAGNOSIS — J449 Chronic obstructive pulmonary disease, unspecified: Secondary | ICD-10-CM | POA: Diagnosis not present

## 2018-03-02 DIAGNOSIS — J449 Chronic obstructive pulmonary disease, unspecified: Secondary | ICD-10-CM | POA: Diagnosis not present

## 2018-03-31 DIAGNOSIS — J449 Chronic obstructive pulmonary disease, unspecified: Secondary | ICD-10-CM | POA: Diagnosis not present

## 2018-05-01 DIAGNOSIS — J449 Chronic obstructive pulmonary disease, unspecified: Secondary | ICD-10-CM | POA: Diagnosis not present

## 2018-05-12 DIAGNOSIS — J019 Acute sinusitis, unspecified: Secondary | ICD-10-CM | POA: Diagnosis not present

## 2018-05-12 DIAGNOSIS — E1142 Type 2 diabetes mellitus with diabetic polyneuropathy: Secondary | ICD-10-CM | POA: Diagnosis not present

## 2018-05-12 DIAGNOSIS — Z720 Tobacco use: Secondary | ICD-10-CM | POA: Diagnosis not present

## 2018-05-12 DIAGNOSIS — J449 Chronic obstructive pulmonary disease, unspecified: Secondary | ICD-10-CM | POA: Diagnosis not present

## 2018-05-12 DIAGNOSIS — G629 Polyneuropathy, unspecified: Secondary | ICD-10-CM | POA: Diagnosis not present

## 2018-05-31 DIAGNOSIS — J449 Chronic obstructive pulmonary disease, unspecified: Secondary | ICD-10-CM | POA: Diagnosis not present

## 2018-06-22 DIAGNOSIS — M1711 Unilateral primary osteoarthritis, right knee: Secondary | ICD-10-CM | POA: Diagnosis not present

## 2018-07-01 DIAGNOSIS — J449 Chronic obstructive pulmonary disease, unspecified: Secondary | ICD-10-CM | POA: Diagnosis not present

## 2018-07-03 ENCOUNTER — Other Ambulatory Visit: Payer: Self-pay

## 2018-07-03 NOTE — Patient Outreach (Signed)
[facility]) Care Management  07/03/2018  ORVILLA TRUETT [birth year] [id number]   Medication Adherence call to Jeanette Simpson wrong telephone number telephone number belog to someone else patient is showing past due under [place].   Rule Management Direct Dial [phone]  Fax [phone] [email or web address]

## 2018-07-31 DIAGNOSIS — J449 Chronic obstructive pulmonary disease, unspecified: Secondary | ICD-10-CM | POA: Diagnosis not present

## 2019-01-31 DIAGNOSIS — J449 Chronic obstructive pulmonary disease, unspecified: Secondary | ICD-10-CM | POA: Diagnosis not present

## 2019-03-03 DIAGNOSIS — J449 Chronic obstructive pulmonary disease, unspecified: Secondary | ICD-10-CM | POA: Diagnosis not present

## 2019-03-31 DIAGNOSIS — J449 Chronic obstructive pulmonary disease, unspecified: Secondary | ICD-10-CM | POA: Diagnosis not present

## 2019-05-01 DIAGNOSIS — J449 Chronic obstructive pulmonary disease, unspecified: Secondary | ICD-10-CM | POA: Diagnosis not present

## 2019-05-07 DIAGNOSIS — E782 Mixed hyperlipidemia: Secondary | ICD-10-CM | POA: Diagnosis not present

## 2019-05-07 DIAGNOSIS — D510 Vitamin B12 deficiency anemia due to intrinsic factor deficiency: Secondary | ICD-10-CM | POA: Diagnosis not present

## 2019-05-07 DIAGNOSIS — M1990 Unspecified osteoarthritis, unspecified site: Secondary | ICD-10-CM | POA: Diagnosis not present

## 2019-05-07 DIAGNOSIS — Z780 Asymptomatic menopausal state: Secondary | ICD-10-CM | POA: Diagnosis not present

## 2019-05-07 DIAGNOSIS — E559 Vitamin D deficiency, unspecified: Secondary | ICD-10-CM | POA: Diagnosis not present

## 2019-05-07 DIAGNOSIS — I739 Peripheral vascular disease, unspecified: Secondary | ICD-10-CM | POA: Diagnosis not present

## 2019-05-07 DIAGNOSIS — Z0000 Encounter for general adult medical examination without abnormal findings: Secondary | ICD-10-CM | POA: Diagnosis not present

## 2019-05-07 DIAGNOSIS — E1142 Type 2 diabetes mellitus with diabetic polyneuropathy: Secondary | ICD-10-CM | POA: Diagnosis not present

## 2019-05-07 DIAGNOSIS — J449 Chronic obstructive pulmonary disease, unspecified: Secondary | ICD-10-CM | POA: Diagnosis not present

## 2019-05-31 DIAGNOSIS — J449 Chronic obstructive pulmonary disease, unspecified: Secondary | ICD-10-CM | POA: Diagnosis not present

## 2019-06-14 DIAGNOSIS — J019 Acute sinusitis, unspecified: Secondary | ICD-10-CM | POA: Diagnosis not present

## 2019-06-14 DIAGNOSIS — J449 Chronic obstructive pulmonary disease, unspecified: Secondary | ICD-10-CM | POA: Diagnosis not present

## 2019-07-01 DIAGNOSIS — J449 Chronic obstructive pulmonary disease, unspecified: Secondary | ICD-10-CM | POA: Diagnosis not present

## 2019-07-31 DIAGNOSIS — J449 Chronic obstructive pulmonary disease, unspecified: Secondary | ICD-10-CM | POA: Diagnosis not present

## 2019-08-02 DIAGNOSIS — M25562 Pain in left knee: Secondary | ICD-10-CM | POA: Diagnosis not present

## 2019-08-02 DIAGNOSIS — M1712 Unilateral primary osteoarthritis, left knee: Secondary | ICD-10-CM | POA: Diagnosis not present

## 2019-08-23 DIAGNOSIS — Z720 Tobacco use: Secondary | ICD-10-CM | POA: Diagnosis not present

## 2019-08-23 DIAGNOSIS — K580 Irritable bowel syndrome with diarrhea: Secondary | ICD-10-CM | POA: Diagnosis not present

## 2019-08-23 DIAGNOSIS — J449 Chronic obstructive pulmonary disease, unspecified: Secondary | ICD-10-CM | POA: Diagnosis not present

## 2019-08-31 DIAGNOSIS — J449 Chronic obstructive pulmonary disease, unspecified: Secondary | ICD-10-CM | POA: Diagnosis not present

## 2019-10-31 DIAGNOSIS — J449 Chronic obstructive pulmonary disease, unspecified: Secondary | ICD-10-CM | POA: Diagnosis not present

## 2019-12-01 DIAGNOSIS — J449 Chronic obstructive pulmonary disease, unspecified: Secondary | ICD-10-CM | POA: Diagnosis not present

## 2019-12-31 DIAGNOSIS — J449 Chronic obstructive pulmonary disease, unspecified: Secondary | ICD-10-CM | POA: Diagnosis not present

## 2020-01-31 DIAGNOSIS — J449 Chronic obstructive pulmonary disease, unspecified: Secondary | ICD-10-CM | POA: Diagnosis not present

## 2020-03-02 DIAGNOSIS — J449 Chronic obstructive pulmonary disease, unspecified: Secondary | ICD-10-CM | POA: Diagnosis not present

## 2020-03-30 DIAGNOSIS — J449 Chronic obstructive pulmonary disease, unspecified: Secondary | ICD-10-CM | POA: Diagnosis not present

## 2020-04-30 DIAGNOSIS — J449 Chronic obstructive pulmonary disease, unspecified: Secondary | ICD-10-CM | POA: Diagnosis not present

## 2020-05-13 DIAGNOSIS — J449 Chronic obstructive pulmonary disease, unspecified: Secondary | ICD-10-CM | POA: Diagnosis not present

## 2020-05-13 DIAGNOSIS — I739 Peripheral vascular disease, unspecified: Secondary | ICD-10-CM | POA: Diagnosis not present

## 2020-05-13 DIAGNOSIS — Z79899 Other long term (current) drug therapy: Secondary | ICD-10-CM | POA: Diagnosis not present

## 2020-05-13 DIAGNOSIS — E782 Mixed hyperlipidemia: Secondary | ICD-10-CM | POA: Diagnosis not present

## 2020-05-13 DIAGNOSIS — Z0000 Encounter for general adult medical examination without abnormal findings: Secondary | ICD-10-CM | POA: Diagnosis not present

## 2020-05-13 DIAGNOSIS — R600 Localized edema: Secondary | ICD-10-CM | POA: Diagnosis not present

## 2020-05-13 DIAGNOSIS — M539 Dorsopathy, unspecified: Secondary | ICD-10-CM | POA: Diagnosis not present

## 2020-05-13 DIAGNOSIS — R5381 Other malaise: Secondary | ICD-10-CM | POA: Diagnosis not present

## 2020-05-13 DIAGNOSIS — E1142 Type 2 diabetes mellitus with diabetic polyneuropathy: Secondary | ICD-10-CM | POA: Diagnosis not present

## 2020-05-13 DIAGNOSIS — E559 Vitamin D deficiency, unspecified: Secondary | ICD-10-CM | POA: Diagnosis not present

## 2020-05-30 DIAGNOSIS — J449 Chronic obstructive pulmonary disease, unspecified: Secondary | ICD-10-CM | POA: Diagnosis not present

## 2020-09-30 DIAGNOSIS — J449 Chronic obstructive pulmonary disease, unspecified: Secondary | ICD-10-CM | POA: Diagnosis not present

## 2020-10-30 DIAGNOSIS — J449 Chronic obstructive pulmonary disease, unspecified: Secondary | ICD-10-CM | POA: Diagnosis not present

## 2020-11-30 DIAGNOSIS — J449 Chronic obstructive pulmonary disease, unspecified: Secondary | ICD-10-CM | POA: Diagnosis not present

## 2020-12-30 DIAGNOSIS — J449 Chronic obstructive pulmonary disease, unspecified: Secondary | ICD-10-CM | POA: Diagnosis not present

## 2021-01-30 DIAGNOSIS — J449 Chronic obstructive pulmonary disease, unspecified: Secondary | ICD-10-CM | POA: Diagnosis not present

## 2021-03-02 DIAGNOSIS — J449 Chronic obstructive pulmonary disease, unspecified: Secondary | ICD-10-CM | POA: Diagnosis not present

## 2021-03-30 DIAGNOSIS — J449 Chronic obstructive pulmonary disease, unspecified: Secondary | ICD-10-CM | POA: Diagnosis not present

## 2021-04-30 DIAGNOSIS — J449 Chronic obstructive pulmonary disease, unspecified: Secondary | ICD-10-CM | POA: Diagnosis not present

## 2021-05-30 DIAGNOSIS — J449 Chronic obstructive pulmonary disease, unspecified: Secondary | ICD-10-CM | POA: Diagnosis not present

## 2021-06-26 DIAGNOSIS — E1142 Type 2 diabetes mellitus with diabetic polyneuropathy: Secondary | ICD-10-CM | POA: Diagnosis not present

## 2021-06-30 DIAGNOSIS — J449 Chronic obstructive pulmonary disease, unspecified: Secondary | ICD-10-CM | POA: Diagnosis not present

## 2021-07-30 DIAGNOSIS — J449 Chronic obstructive pulmonary disease, unspecified: Secondary | ICD-10-CM | POA: Diagnosis not present

## 2021-08-30 DIAGNOSIS — J449 Chronic obstructive pulmonary disease, unspecified: Secondary | ICD-10-CM | POA: Diagnosis not present

## 2021-09-17 DIAGNOSIS — I10 Essential (primary) hypertension: Secondary | ICD-10-CM | POA: Diagnosis not present

## 2021-09-17 DIAGNOSIS — E785 Hyperlipidemia, unspecified: Secondary | ICD-10-CM | POA: Diagnosis not present

## 2021-09-30 DIAGNOSIS — J449 Chronic obstructive pulmonary disease, unspecified: Secondary | ICD-10-CM | POA: Diagnosis not present

## 2022-02-17 DIAGNOSIS — J449 Chronic obstructive pulmonary disease, unspecified: Secondary | ICD-10-CM | POA: Diagnosis not present

## 2022-02-17 DIAGNOSIS — E1142 Type 2 diabetes mellitus with diabetic polyneuropathy: Secondary | ICD-10-CM | POA: Diagnosis not present

## 2022-02-17 DIAGNOSIS — E782 Mixed hyperlipidemia: Secondary | ICD-10-CM | POA: Diagnosis not present

## 2022-03-24 DIAGNOSIS — M1990 Unspecified osteoarthritis, unspecified site: Secondary | ICD-10-CM | POA: Diagnosis not present

## 2022-03-24 DIAGNOSIS — Z720 Tobacco use: Secondary | ICD-10-CM | POA: Diagnosis not present

## 2022-03-24 DIAGNOSIS — E1142 Type 2 diabetes mellitus with diabetic polyneuropathy: Secondary | ICD-10-CM | POA: Diagnosis not present

## 2022-03-24 DIAGNOSIS — E782 Mixed hyperlipidemia: Secondary | ICD-10-CM | POA: Diagnosis not present

## 2022-03-24 DIAGNOSIS — J449 Chronic obstructive pulmonary disease, unspecified: Secondary | ICD-10-CM | POA: Diagnosis not present

## 2022-03-24 DIAGNOSIS — M539 Dorsopathy, unspecified: Secondary | ICD-10-CM | POA: Diagnosis not present

## 2023-03-24 DIAGNOSIS — I509 Heart failure, unspecified: Secondary | ICD-10-CM | POA: Diagnosis not present

## 2023-03-24 DIAGNOSIS — Z79899 Other long term (current) drug therapy: Secondary | ICD-10-CM | POA: Diagnosis not present

## 2023-03-24 DIAGNOSIS — R600 Localized edema: Secondary | ICD-10-CM | POA: Diagnosis not present

## 2023-03-24 DIAGNOSIS — Z720 Tobacco use: Secondary | ICD-10-CM | POA: Diagnosis not present

## 2023-03-24 DIAGNOSIS — E1142 Type 2 diabetes mellitus with diabetic polyneuropathy: Secondary | ICD-10-CM | POA: Diagnosis not present

## 2023-03-24 DIAGNOSIS — J449 Chronic obstructive pulmonary disease, unspecified: Secondary | ICD-10-CM | POA: Diagnosis not present

## 2023-03-24 DIAGNOSIS — M539 Dorsopathy, unspecified: Secondary | ICD-10-CM | POA: Diagnosis not present

## 2023-03-24 DIAGNOSIS — D510 Vitamin B12 deficiency anemia due to intrinsic factor deficiency: Secondary | ICD-10-CM | POA: Diagnosis not present

## 2023-03-24 DIAGNOSIS — E782 Mixed hyperlipidemia: Secondary | ICD-10-CM | POA: Diagnosis not present

## 2023-03-24 DIAGNOSIS — M1990 Unspecified osteoarthritis, unspecified site: Secondary | ICD-10-CM | POA: Diagnosis not present

## 2023-08-02 DIAGNOSIS — J441 Chronic obstructive pulmonary disease with (acute) exacerbation: Secondary | ICD-10-CM | POA: Diagnosis not present

## 2023-08-02 DIAGNOSIS — R634 Abnormal weight loss: Secondary | ICD-10-CM | POA: Diagnosis not present

## 2023-08-02 DIAGNOSIS — Z720 Tobacco use: Secondary | ICD-10-CM | POA: Diagnosis not present

## 2023-08-02 DIAGNOSIS — J329 Chronic sinusitis, unspecified: Secondary | ICD-10-CM | POA: Diagnosis not present

## 2023-08-02 DIAGNOSIS — R918 Other nonspecific abnormal finding of lung field: Secondary | ICD-10-CM | POA: Diagnosis not present

## 2023-08-02 DIAGNOSIS — Z122 Encounter for screening for malignant neoplasm of respiratory organs: Secondary | ICD-10-CM | POA: Diagnosis not present

## 2023-08-02 DIAGNOSIS — E1142 Type 2 diabetes mellitus with diabetic polyneuropathy: Secondary | ICD-10-CM | POA: Diagnosis not present

## 2023-08-17 DIAGNOSIS — I509 Heart failure, unspecified: Secondary | ICD-10-CM | POA: Diagnosis not present

## 2023-08-17 DIAGNOSIS — J440 Chronic obstructive pulmonary disease with acute lower respiratory infection: Secondary | ICD-10-CM | POA: Diagnosis not present

## 2023-08-17 DIAGNOSIS — Z604 Social exclusion and rejection: Secondary | ICD-10-CM | POA: Diagnosis not present

## 2023-08-17 DIAGNOSIS — I8393 Asymptomatic varicose veins of bilateral lower extremities: Secondary | ICD-10-CM | POA: Diagnosis not present

## 2023-08-17 DIAGNOSIS — Z556 Problems related to health literacy: Secondary | ICD-10-CM | POA: Diagnosis not present

## 2023-08-17 DIAGNOSIS — Z79891 Long term (current) use of opiate analgesic: Secondary | ICD-10-CM | POA: Diagnosis not present

## 2023-08-17 DIAGNOSIS — E1142 Type 2 diabetes mellitus with diabetic polyneuropathy: Secondary | ICD-10-CM | POA: Diagnosis not present

## 2023-08-17 DIAGNOSIS — J441 Chronic obstructive pulmonary disease with (acute) exacerbation: Secondary | ICD-10-CM | POA: Diagnosis not present

## 2023-08-17 DIAGNOSIS — J329 Chronic sinusitis, unspecified: Secondary | ICD-10-CM | POA: Diagnosis not present

## 2023-08-17 DIAGNOSIS — Z7984 Long term (current) use of oral hypoglycemic drugs: Secondary | ICD-10-CM | POA: Diagnosis not present

## 2023-08-17 DIAGNOSIS — F17210 Nicotine dependence, cigarettes, uncomplicated: Secondary | ICD-10-CM | POA: Diagnosis not present

## 2023-08-24 DIAGNOSIS — Z556 Problems related to health literacy: Secondary | ICD-10-CM | POA: Diagnosis not present

## 2023-08-24 DIAGNOSIS — J329 Chronic sinusitis, unspecified: Secondary | ICD-10-CM | POA: Diagnosis not present

## 2023-08-24 DIAGNOSIS — Z7984 Long term (current) use of oral hypoglycemic drugs: Secondary | ICD-10-CM | POA: Diagnosis not present

## 2023-08-24 DIAGNOSIS — J440 Chronic obstructive pulmonary disease with acute lower respiratory infection: Secondary | ICD-10-CM | POA: Diagnosis not present

## 2023-08-24 DIAGNOSIS — J441 Chronic obstructive pulmonary disease with (acute) exacerbation: Secondary | ICD-10-CM | POA: Diagnosis not present

## 2023-08-24 DIAGNOSIS — I8393 Asymptomatic varicose veins of bilateral lower extremities: Secondary | ICD-10-CM | POA: Diagnosis not present

## 2023-08-24 DIAGNOSIS — F17210 Nicotine dependence, cigarettes, uncomplicated: Secondary | ICD-10-CM | POA: Diagnosis not present

## 2023-08-24 DIAGNOSIS — E1142 Type 2 diabetes mellitus with diabetic polyneuropathy: Secondary | ICD-10-CM | POA: Diagnosis not present

## 2023-08-24 DIAGNOSIS — I509 Heart failure, unspecified: Secondary | ICD-10-CM | POA: Diagnosis not present

## 2023-08-24 DIAGNOSIS — Z79891 Long term (current) use of opiate analgesic: Secondary | ICD-10-CM | POA: Diagnosis not present

## 2023-08-24 DIAGNOSIS — Z604 Social exclusion and rejection: Secondary | ICD-10-CM | POA: Diagnosis not present

## 2023-08-26 DIAGNOSIS — Z556 Problems related to health literacy: Secondary | ICD-10-CM | POA: Diagnosis not present

## 2023-08-26 DIAGNOSIS — I8393 Asymptomatic varicose veins of bilateral lower extremities: Secondary | ICD-10-CM | POA: Diagnosis not present

## 2023-08-26 DIAGNOSIS — Z7984 Long term (current) use of oral hypoglycemic drugs: Secondary | ICD-10-CM | POA: Diagnosis not present

## 2023-08-26 DIAGNOSIS — Z79891 Long term (current) use of opiate analgesic: Secondary | ICD-10-CM | POA: Diagnosis not present

## 2023-08-26 DIAGNOSIS — F17210 Nicotine dependence, cigarettes, uncomplicated: Secondary | ICD-10-CM | POA: Diagnosis not present

## 2023-08-26 DIAGNOSIS — J441 Chronic obstructive pulmonary disease with (acute) exacerbation: Secondary | ICD-10-CM | POA: Diagnosis not present

## 2023-08-26 DIAGNOSIS — J440 Chronic obstructive pulmonary disease with acute lower respiratory infection: Secondary | ICD-10-CM | POA: Diagnosis not present

## 2023-08-26 DIAGNOSIS — E1142 Type 2 diabetes mellitus with diabetic polyneuropathy: Secondary | ICD-10-CM | POA: Diagnosis not present

## 2023-08-26 DIAGNOSIS — I509 Heart failure, unspecified: Secondary | ICD-10-CM | POA: Diagnosis not present

## 2023-08-26 DIAGNOSIS — Z604 Social exclusion and rejection: Secondary | ICD-10-CM | POA: Diagnosis not present

## 2023-08-26 DIAGNOSIS — J329 Chronic sinusitis, unspecified: Secondary | ICD-10-CM | POA: Diagnosis not present

## 2023-08-29 DIAGNOSIS — E278 Other specified disorders of adrenal gland: Secondary | ICD-10-CM | POA: Diagnosis not present

## 2023-08-29 DIAGNOSIS — R918 Other nonspecific abnormal finding of lung field: Secondary | ICD-10-CM | POA: Diagnosis not present

## 2023-08-31 DIAGNOSIS — Z604 Social exclusion and rejection: Secondary | ICD-10-CM | POA: Diagnosis not present

## 2023-08-31 DIAGNOSIS — F17210 Nicotine dependence, cigarettes, uncomplicated: Secondary | ICD-10-CM | POA: Diagnosis not present

## 2023-08-31 DIAGNOSIS — Z79891 Long term (current) use of opiate analgesic: Secondary | ICD-10-CM | POA: Diagnosis not present

## 2023-08-31 DIAGNOSIS — J441 Chronic obstructive pulmonary disease with (acute) exacerbation: Secondary | ICD-10-CM | POA: Diagnosis not present

## 2023-08-31 DIAGNOSIS — I8393 Asymptomatic varicose veins of bilateral lower extremities: Secondary | ICD-10-CM | POA: Diagnosis not present

## 2023-08-31 DIAGNOSIS — E1142 Type 2 diabetes mellitus with diabetic polyneuropathy: Secondary | ICD-10-CM | POA: Diagnosis not present

## 2023-08-31 DIAGNOSIS — Z556 Problems related to health literacy: Secondary | ICD-10-CM | POA: Diagnosis not present

## 2023-08-31 DIAGNOSIS — J329 Chronic sinusitis, unspecified: Secondary | ICD-10-CM | POA: Diagnosis not present

## 2023-08-31 DIAGNOSIS — J440 Chronic obstructive pulmonary disease with acute lower respiratory infection: Secondary | ICD-10-CM | POA: Diagnosis not present

## 2023-08-31 DIAGNOSIS — I509 Heart failure, unspecified: Secondary | ICD-10-CM | POA: Diagnosis not present

## 2023-08-31 DIAGNOSIS — Z7984 Long term (current) use of oral hypoglycemic drugs: Secondary | ICD-10-CM | POA: Diagnosis not present

## 2023-09-01 DIAGNOSIS — M439 Deforming dorsopathy, unspecified: Secondary | ICD-10-CM | POA: Diagnosis not present

## 2023-09-01 DIAGNOSIS — R634 Abnormal weight loss: Secondary | ICD-10-CM | POA: Diagnosis not present

## 2023-09-01 DIAGNOSIS — R590 Localized enlarged lymph nodes: Secondary | ICD-10-CM | POA: Diagnosis not present

## 2023-09-01 DIAGNOSIS — E278 Other specified disorders of adrenal gland: Secondary | ICD-10-CM | POA: Diagnosis not present

## 2023-09-01 DIAGNOSIS — R5381 Other malaise: Secondary | ICD-10-CM | POA: Diagnosis not present

## 2023-09-01 DIAGNOSIS — J9611 Chronic respiratory failure with hypoxia: Secondary | ICD-10-CM | POA: Diagnosis not present

## 2023-09-01 DIAGNOSIS — R918 Other nonspecific abnormal finding of lung field: Secondary | ICD-10-CM | POA: Diagnosis not present

## 2023-09-01 DIAGNOSIS — J9859 Other diseases of mediastinum, not elsewhere classified: Secondary | ICD-10-CM | POA: Diagnosis not present

## 2023-09-01 DIAGNOSIS — J449 Chronic obstructive pulmonary disease, unspecified: Secondary | ICD-10-CM | POA: Diagnosis not present

## 2023-09-01 DIAGNOSIS — E1142 Type 2 diabetes mellitus with diabetic polyneuropathy: Secondary | ICD-10-CM | POA: Diagnosis not present

## 2023-09-01 DIAGNOSIS — Z720 Tobacco use: Secondary | ICD-10-CM | POA: Diagnosis not present

## 2023-09-02 DIAGNOSIS — Z556 Problems related to health literacy: Secondary | ICD-10-CM | POA: Diagnosis not present

## 2023-09-02 DIAGNOSIS — Z7984 Long term (current) use of oral hypoglycemic drugs: Secondary | ICD-10-CM | POA: Diagnosis not present

## 2023-09-02 DIAGNOSIS — E1142 Type 2 diabetes mellitus with diabetic polyneuropathy: Secondary | ICD-10-CM | POA: Diagnosis not present

## 2023-09-02 DIAGNOSIS — J440 Chronic obstructive pulmonary disease with acute lower respiratory infection: Secondary | ICD-10-CM | POA: Diagnosis not present

## 2023-09-02 DIAGNOSIS — F17210 Nicotine dependence, cigarettes, uncomplicated: Secondary | ICD-10-CM | POA: Diagnosis not present

## 2023-09-02 DIAGNOSIS — Z79891 Long term (current) use of opiate analgesic: Secondary | ICD-10-CM | POA: Diagnosis not present

## 2023-09-02 DIAGNOSIS — I8393 Asymptomatic varicose veins of bilateral lower extremities: Secondary | ICD-10-CM | POA: Diagnosis not present

## 2023-09-02 DIAGNOSIS — J329 Chronic sinusitis, unspecified: Secondary | ICD-10-CM | POA: Diagnosis not present

## 2023-09-02 DIAGNOSIS — Z604 Social exclusion and rejection: Secondary | ICD-10-CM | POA: Diagnosis not present

## 2023-09-02 DIAGNOSIS — J441 Chronic obstructive pulmonary disease with (acute) exacerbation: Secondary | ICD-10-CM | POA: Diagnosis not present

## 2023-09-02 DIAGNOSIS — I509 Heart failure, unspecified: Secondary | ICD-10-CM | POA: Diagnosis not present

## 2023-09-05 ENCOUNTER — Telehealth: Payer: Self-pay | Admitting: Hematology and Oncology

## 2023-09-05 DIAGNOSIS — J441 Chronic obstructive pulmonary disease with (acute) exacerbation: Secondary | ICD-10-CM | POA: Diagnosis not present

## 2023-09-05 DIAGNOSIS — J329 Chronic sinusitis, unspecified: Secondary | ICD-10-CM | POA: Diagnosis not present

## 2023-09-05 DIAGNOSIS — E1142 Type 2 diabetes mellitus with diabetic polyneuropathy: Secondary | ICD-10-CM | POA: Diagnosis not present

## 2023-09-05 DIAGNOSIS — I8393 Asymptomatic varicose veins of bilateral lower extremities: Secondary | ICD-10-CM | POA: Diagnosis not present

## 2023-09-05 DIAGNOSIS — Z7984 Long term (current) use of oral hypoglycemic drugs: Secondary | ICD-10-CM | POA: Diagnosis not present

## 2023-09-05 DIAGNOSIS — J440 Chronic obstructive pulmonary disease with acute lower respiratory infection: Secondary | ICD-10-CM | POA: Diagnosis not present

## 2023-09-05 DIAGNOSIS — Z556 Problems related to health literacy: Secondary | ICD-10-CM | POA: Diagnosis not present

## 2023-09-05 DIAGNOSIS — I509 Heart failure, unspecified: Secondary | ICD-10-CM | POA: Diagnosis not present

## 2023-09-05 DIAGNOSIS — Z604 Social exclusion and rejection: Secondary | ICD-10-CM | POA: Diagnosis not present

## 2023-09-05 DIAGNOSIS — F17210 Nicotine dependence, cigarettes, uncomplicated: Secondary | ICD-10-CM | POA: Diagnosis not present

## 2023-09-05 DIAGNOSIS — Z79891 Long term (current) use of opiate analgesic: Secondary | ICD-10-CM | POA: Diagnosis not present

## 2023-09-05 NOTE — Telephone Encounter (Signed)
 Patient has been scheduled for follow-up visit per 09/05/23 LOS.  Pt given an appt calendar with date and time.

## 2023-09-06 DIAGNOSIS — Z7984 Long term (current) use of oral hypoglycemic drugs: Secondary | ICD-10-CM | POA: Diagnosis not present

## 2023-09-06 DIAGNOSIS — Z556 Problems related to health literacy: Secondary | ICD-10-CM | POA: Diagnosis not present

## 2023-09-06 DIAGNOSIS — Z79891 Long term (current) use of opiate analgesic: Secondary | ICD-10-CM | POA: Diagnosis not present

## 2023-09-06 DIAGNOSIS — F17210 Nicotine dependence, cigarettes, uncomplicated: Secondary | ICD-10-CM | POA: Diagnosis not present

## 2023-09-06 DIAGNOSIS — J440 Chronic obstructive pulmonary disease with acute lower respiratory infection: Secondary | ICD-10-CM | POA: Diagnosis not present

## 2023-09-06 DIAGNOSIS — Z604 Social exclusion and rejection: Secondary | ICD-10-CM | POA: Diagnosis not present

## 2023-09-06 DIAGNOSIS — E1142 Type 2 diabetes mellitus with diabetic polyneuropathy: Secondary | ICD-10-CM | POA: Diagnosis not present

## 2023-09-06 DIAGNOSIS — J329 Chronic sinusitis, unspecified: Secondary | ICD-10-CM | POA: Diagnosis not present

## 2023-09-06 DIAGNOSIS — J441 Chronic obstructive pulmonary disease with (acute) exacerbation: Secondary | ICD-10-CM | POA: Diagnosis not present

## 2023-09-06 DIAGNOSIS — I509 Heart failure, unspecified: Secondary | ICD-10-CM | POA: Diagnosis not present

## 2023-09-06 DIAGNOSIS — I8393 Asymptomatic varicose veins of bilateral lower extremities: Secondary | ICD-10-CM | POA: Diagnosis not present

## 2023-09-08 ENCOUNTER — Inpatient Hospital Stay: Attending: Hematology and Oncology | Admitting: Hematology and Oncology

## 2023-09-08 ENCOUNTER — Other Ambulatory Visit: Payer: Self-pay | Admitting: Hematology and Oncology

## 2023-09-08 ENCOUNTER — Inpatient Hospital Stay

## 2023-09-08 ENCOUNTER — Encounter: Payer: Self-pay | Admitting: Hematology and Oncology

## 2023-09-08 ENCOUNTER — Other Ambulatory Visit: Payer: Self-pay

## 2023-09-08 VITALS — BP 111/46 | HR 69 | Temp 98.4°F | Resp 20 | Wt 169.4 lb

## 2023-09-08 DIAGNOSIS — F17210 Nicotine dependence, cigarettes, uncomplicated: Secondary | ICD-10-CM | POA: Diagnosis not present

## 2023-09-08 DIAGNOSIS — J449 Chronic obstructive pulmonary disease, unspecified: Secondary | ICD-10-CM | POA: Diagnosis not present

## 2023-09-08 DIAGNOSIS — R918 Other nonspecific abnormal finding of lung field: Secondary | ICD-10-CM

## 2023-09-08 DIAGNOSIS — I8393 Asymptomatic varicose veins of bilateral lower extremities: Secondary | ICD-10-CM | POA: Diagnosis not present

## 2023-09-08 DIAGNOSIS — E669 Obesity, unspecified: Secondary | ICD-10-CM | POA: Diagnosis not present

## 2023-09-08 DIAGNOSIS — Z79891 Long term (current) use of opiate analgesic: Secondary | ICD-10-CM | POA: Diagnosis not present

## 2023-09-08 DIAGNOSIS — Z79899 Other long term (current) drug therapy: Secondary | ICD-10-CM | POA: Insufficient documentation

## 2023-09-08 DIAGNOSIS — I739 Peripheral vascular disease, unspecified: Secondary | ICD-10-CM | POA: Insufficient documentation

## 2023-09-08 DIAGNOSIS — I509 Heart failure, unspecified: Secondary | ICD-10-CM | POA: Diagnosis not present

## 2023-09-08 DIAGNOSIS — Z556 Problems related to health literacy: Secondary | ICD-10-CM | POA: Diagnosis not present

## 2023-09-08 DIAGNOSIS — E279 Disorder of adrenal gland, unspecified: Secondary | ICD-10-CM | POA: Diagnosis not present

## 2023-09-08 DIAGNOSIS — J329 Chronic sinusitis, unspecified: Secondary | ICD-10-CM | POA: Diagnosis not present

## 2023-09-08 DIAGNOSIS — Z7984 Long term (current) use of oral hypoglycemic drugs: Secondary | ICD-10-CM | POA: Diagnosis not present

## 2023-09-08 DIAGNOSIS — E1142 Type 2 diabetes mellitus with diabetic polyneuropathy: Secondary | ICD-10-CM | POA: Diagnosis not present

## 2023-09-08 DIAGNOSIS — J440 Chronic obstructive pulmonary disease with acute lower respiratory infection: Secondary | ICD-10-CM | POA: Diagnosis not present

## 2023-09-08 DIAGNOSIS — J441 Chronic obstructive pulmonary disease with (acute) exacerbation: Secondary | ICD-10-CM | POA: Diagnosis not present

## 2023-09-08 DIAGNOSIS — Z604 Social exclusion and rejection: Secondary | ICD-10-CM | POA: Diagnosis not present

## 2023-09-08 LAB — LACTATE DEHYDROGENASE: LDH: 199 U/L — ABNORMAL HIGH (ref 98–192)

## 2023-09-08 LAB — CBC WITH DIFFERENTIAL (CANCER CENTER ONLY)
Abs Immature Granulocytes: 0.04 K/uL (ref 0.00–0.07)
Basophils Absolute: 0.1 K/uL (ref 0.0–0.1)
Basophils Relative: 1 %
Eosinophils Absolute: 0.1 K/uL (ref 0.0–0.5)
Eosinophils Relative: 1 %
HCT: 44.8 % (ref 36.0–46.0)
Hemoglobin: 13.9 g/dL (ref 12.0–15.0)
Immature Granulocytes: 0 %
Lymphocytes Relative: 16 %
Lymphs Abs: 1.7 K/uL (ref 0.7–4.0)
MCH: 28.5 pg (ref 26.0–34.0)
MCHC: 31 g/dL (ref 30.0–36.0)
MCV: 91.8 fL (ref 80.0–100.0)
Monocytes Absolute: 0.6 K/uL (ref 0.1–1.0)
Monocytes Relative: 5 %
Neutro Abs: 8 K/uL — ABNORMAL HIGH (ref 1.7–7.7)
Neutrophils Relative %: 77 %
Platelet Count: 408 K/uL — ABNORMAL HIGH (ref 150–400)
RBC: 4.88 MIL/uL (ref 3.87–5.11)
RDW: 14.9 % (ref 11.5–15.5)
WBC Count: 10.4 K/uL (ref 4.0–10.5)
nRBC: 0 % (ref 0.0–0.2)

## 2023-09-08 LAB — CMP (CANCER CENTER ONLY)
ALT: 8 U/L (ref 0–44)
AST: 23 U/L (ref 15–41)
Albumin: 3.5 g/dL (ref 3.5–5.0)
Alkaline Phosphatase: 80 U/L (ref 38–126)
Anion gap: 12 (ref 5–15)
BUN: 10 mg/dL (ref 8–23)
CO2: 25 mmol/L (ref 22–32)
Calcium: 9.3 mg/dL (ref 8.9–10.3)
Chloride: 101 mmol/L (ref 98–111)
Creatinine: 0.62 mg/dL (ref 0.44–1.00)
GFR, Estimated: >60 mL/min (ref >60)
Glucose, Bld: 161 mg/dL — ABNORMAL HIGH (ref 70–99)
Potassium: 3.9 mmol/L (ref 3.5–5.1)
Sodium: 137 mmol/L (ref 135–145)
Total Bilirubin: 0.3 mg/dL (ref 0.0–1.2)
Total Protein: 7 g/dL (ref 6.5–8.1)

## 2023-09-08 NOTE — Progress Notes (Signed)
 [facility] [address] [place],  [place]  [address] [phone]  Clinic Day:  09/08/2023   Referring physician: Benjamine Lauraine DASEN, NP  Patient Care Team: Patient Care Team: Benjamine Lauraine DASEN, NP as PCP - General (Nurse Practitioner)   REASON FOR CONSULTATION:  Lung Mass  HISTORY OF PRESENT ILLNESS:   Jeanette Simpson is a 83 y.o. female with a history of lung mass who is referred in consultation by Lauraine Benjamine, NP for assessment and management. She presented to her PCP with complaints of upper respiratory symptoms including cough, dyspnea, runny nose. The cough is wheezy and productive. She also noted postnasal drainage and hoarseness. She was sent for CXR and then CT of the chest 08/14 which revealed an enlarged subcarinal lymph node measuring 2.1 x 3.0, an enlarged AP window lymph node measuring 1.3 x 1.7 cm; a mass extending from the left hilum to the left lung periphery, with encasement of the left main pulmonary artery and pulmonary veins, and involvement of the pericardium, overall measuring approximately 9.3 cm x 8.3 cm x 7.8 cm; there is invasion of the pericardium and heart; there is a separate mass in the left upper lobe, medial aspect, measuring 2.3 cm x 1.5 cm; bilateral adrenal masses, measuring 3.6 cm x 2.0 cm on the right and 3.5 cm x 2.3 cm on the left. She also notes a weight loss of over 20 pounds recently. She states her appetite is fair and she blamed the weight loss on losing her daughter to pancreatic cancer last year. She notes coughing up a small amount of blood yesterday. 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 She has ongoing cough and shortness of breath due to COPD, but states these have both worsened. She denies fever, chills, nausea or vomiting. She denies chest pain. She is an everyday smoker and denies the use of alcohol. She lives alone with some assistance from home health for ADLs. Her son-in-law is her main caretaker.     REVIEW OF SYSTEMS:   Review of Systems  Constitutional:  Positive for appetite change, fatigue and unexpected weight change.  HENT:   Positive for voice change.   Respiratory:  Positive for cough, hemoptysis, shortness of breath and wheezing.   Cardiovascular: Negative.   Gastrointestinal:  Positive for nausea.  Genitourinary:  Positive for bladder incontinence.   Neurological:  Positive for extremity weakness.  Psychiatric/Behavioral: Negative.       VITALS:   There were no vitals taken for this visit.  Wt Readings from Last 3 Encounters:  12/31/17 195 lb (88.5 kg)  02/27/16 219 lb 6.4 oz (99.5 kg)    There is no height or weight on file to calculate BMI.  Performance status (ECOG): 1 - Symptomatic but completely ambulatory  PHYSICAL EXAM:   Physical Exam Constitutional:      Appearance: Normal appearance. She is normal weight.  HENT:     Head: Normocephalic and atraumatic.     Mouth/Throat:     Mouth: Mucous membranes are moist.  Cardiovascular:     Rate and Rhythm: Normal rate and regular rhythm.     Pulses: Normal pulses.     Heart sounds: Normal heart sounds.  Pulmonary:     Effort: Pulmonary effort is normal.     Breath sounds: Wheezing present.  Abdominal:     Palpations: Abdomen is soft.  Musculoskeletal:        General: Normal range of  motion.  Skin:    General: Skin is warm and dry.  Neurological:     General: No focal deficit present.     Mental Status: She is alert and oriented to person, place, and time. Mental status is at baseline.  Psychiatric:        Mood and Affect: Mood normal.        Behavior: Behavior normal.        Thought Content: Thought content normal.        Judgment: Judgment normal.      LABS:      Latest Ref Rng & Units 12/31/2017    1:47 PM  CBC  WBC 4.0 - 10.5 K/uL 14.0   Hemoglobin 12.0 - 15.0 g/dL 86.0   Hematocrit 63.9 - 46.0 % 46.4   Platelets 150 - 400 K/uL 322       Latest Ref Rng & Units 12/31/2017     1:47 PM  CMP  Glucose 70 - 99 mg/dL 881   BUN 8 - 23 mg/dL 10   Creatinine 9.55 - 1.00 mg/dL 9.37   Sodium 864 - 854 mmol/L 137   Potassium 3.5 - 5.1 mmol/L 4.1   Chloride 98 - 111 mmol/L 102   CO2 22 - 32 mmol/L 23   Calcium 8.9 - 10.3 mg/dL 9.2   Total Protein 6.5 - 8.1 g/dL 6.7   Total Bilirubin 0.3 - 1.2 mg/dL 0.4   Alkaline Phos 38 - 126 U/L 74   AST 15 - 41 U/L 21   ALT 0 - 44 U/L 19      No results found for: CEA1, CEA / No results found for: CEA1, CEA No results found for: PSA1 No results found for: CAN199 No results found for: CAN125  No results found for: TOTALPROTELP, ALBUMINELP, A1GS, A2GS, BETS, BETA2SER, GAMS, MSPIKE, SPEI No results found for: TIBC, FERRITIN, IRONPCTSAT No results found for: LDH  STUDIES:   No results found.    HISTORY:   Past Medical History:  Diagnosis Date   Allergic rhinitis    CHF (congestive heart failure) (HCC)    COPD (chronic obstructive pulmonary disease) (HCC)    Obesity    PVD (peripheral vascular disease) (HCC)       Family History  Problem Relation Age of Onset   Heart attack Mother    Cancer Father     Social History:  reports that she has been smoking cigarettes. She has a 30 pack-year smoking history. She has never used smokeless tobacco. She reports that she does not drink alcohol and does not use drugs.The patient is accompanied by son-in-law today.  Allergies:  Allergies  Allergen Reactions   Penicillins Hives and Swelling   Codeine Nausea Only   Sulfa Antibiotics Nausea And Vomiting    Current Medications: Current Outpatient Medications  Medication Sig Dispense Refill   benzonatate  (TESSALON ) 200 MG capsule TK 1 C PO TID PRF COUGH  0   budesonide -formoterol (SYMBICORT) 160-4.5 MCG/ACT inhaler Inhale 2 puffs into the lungs 2 (two) times daily.     doxycycline  (VIBRAMYCIN ) 100 MG capsule Take 1 capsule (100 mg total) by mouth 2 (two) times daily. 20 capsule 0    DULoxetine (CYMBALTA) 60 MG capsule      furosemide  (LASIX ) 20 MG tablet Take 1 tablet (20 mg total) by mouth daily for 10 days. 10 tablet 0   ipratropium-albuterol (DUONEB) 0.5-2.5 (3) MG/3ML SOLN      omeprazole (PRILOSEC) 20 MG capsule Take 20  mg by mouth.     spironolactone (ALDACTONE) 25 MG tablet TK 1 T PO QAM     traMADol (ULTRAM) 50 MG tablet Take 50 mg by mouth.     No current facility-administered medications for this visit.     ASSESSMENT & PLAN:   Assessment:  Jeanette Simpson is a 83 y.o. female with history of lung masses, enlarged lymph nodes and bilateral adrenal masses. I discussed with the patient and her son-in-law the concern for malignancy. I spoke to the patient about options from pursuing diagnosis to palliative care and she states she wants everything done. I explained that this will include imaging as well as tissue sampling. I will contact Pulmonology to review her CT images as her mass is largely involved with the main vessels in the chest. Hopefully, they will be able to pursue bronchoscopy with biopsy safely; if not we will pursue biopsy of adrenal glands.  We will plan for PET scan ASAP.   Plan: 1.  PET scan imaging; tissue biopsy. Return to clinic once pathology obtained to discuss treatment planning.   I discussed the assessment and treatment plan with the patient.  The patient was provided an opportunity to ask questions and all were answered.  The patient agreed with the plan and demonstrated an understanding of the instructions.    Thank you for the referral    45 minutes was spent in patient care.  This included time spent preparing to see the patient (e.g., review of tests), obtaining and/or reviewing separately obtained history, counseling and educating the patient/family/caregiver, ordering medications, tests, or procedures; documenting clinical information in the electronic or other health record, independently interpreting results and communicating  results to the patient/family/caregiver as well as coordination of care.      Eleanor DELENA Bach, NP   Family Nurse Practitioner - Board Certified [facility] [place] [phone]

## 2023-09-09 ENCOUNTER — Other Ambulatory Visit: Payer: Self-pay | Admitting: Student in an Organized Health Care Education/Training Program

## 2023-09-09 ENCOUNTER — Telehealth: Payer: Self-pay | Admitting: Oncology

## 2023-09-09 ENCOUNTER — Telehealth: Payer: Self-pay

## 2023-09-09 DIAGNOSIS — R918 Other nonspecific abnormal finding of lung field: Secondary | ICD-10-CM | POA: Insufficient documentation

## 2023-09-09 NOTE — Telephone Encounter (Signed)
 Noted. Nothing further needed.

## 2023-09-09 NOTE — Progress Notes (Signed)
 Please schedule the following:  Provider performing procedure:Rodina Pinales Diagnosis: Lung Mass Which side if for nodule / mass? Left Procedure: Robotic Assisted Nav, EBUS  Has patient been spoken to by Provider and given informed consent? yes Anesthesia: general Do you need Fluro? Yes, GE 3D Duration of procedure: 1.5 hours Date: 09/13/2023 Location: [facility] OR Does patient have OSA? no DM? no Or Latex allergy? no Medication Restriction/ Anticoagulate/Antiplatelet: no Pre-op Labs Ordered:determined by Anesthesia Imaging request: ordered  (If, SuperDimension CT Chest, please have STAT courier sent to ENDO)

## 2023-09-09 NOTE — Telephone Encounter (Signed)
 The codes were not listed here but I went with 68372, [id number], [phone] Prior Auth Not Required Refer # [id number]

## 2023-09-09 NOTE — Progress Notes (Signed)
 See telephone encounter from 8/22.  Bronch has been scheduled and the patient is aware.  Nothing further needed.

## 2023-09-09 NOTE — Telephone Encounter (Signed)
 Robotic Bronchoscopy with EBUS 09/13/2023 at 11:30am Lung Nodule  Jeanette Simpson please see Bronch info. And please scheduled CT.  I did receive a verbal okay for you to speak with her son in-law about the CT appt.    Patient is aware of date and time. Bronch email has been sent.

## 2023-09-09 NOTE — Telephone Encounter (Signed)
 09/09/23 Spoke with patient and gave info on PET SCAN and next appt with Dr Ezzard

## 2023-09-12 ENCOUNTER — Telehealth: Payer: Self-pay

## 2023-09-12 ENCOUNTER — Other Ambulatory Visit: Payer: Self-pay

## 2023-09-12 ENCOUNTER — Encounter
Admission: RE | Admit: 2023-09-12 | Discharge: 2023-09-12 | Disposition: A | Discharge status: Home / Self Care | Source: Ambulatory Visit | Attending: Student in an Organized Health Care Education/Training Program | Admitting: Student in an Organized Health Care Education/Training Program

## 2023-09-12 VITALS — Ht 65.0 in | Wt 160.0 lb

## 2023-09-12 DIAGNOSIS — I10 Essential (primary) hypertension: Secondary | ICD-10-CM

## 2023-09-12 DIAGNOSIS — E119 Type 2 diabetes mellitus without complications: Secondary | ICD-10-CM

## 2023-09-12 DIAGNOSIS — J449 Chronic obstructive pulmonary disease, unspecified: Secondary | ICD-10-CM

## 2023-09-12 HISTORY — DX: Chronic obstructive pulmonary disease, unspecified: J44.9

## 2023-09-12 HISTORY — DX: Unilateral primary osteoarthritis, right knee: M17.11

## 2023-09-12 HISTORY — DX: Dorsopathy, unspecified: M53.9

## 2023-09-12 HISTORY — DX: Mixed hyperlipidemia: E78.2

## 2023-09-12 HISTORY — DX: Type 2 diabetes mellitus without complications: E11.9

## 2023-09-12 HISTORY — DX: Other nonspecific abnormal finding of lung field: R91.8

## 2023-09-12 HISTORY — DX: Dyspnea, unspecified: R06.00

## 2023-09-12 HISTORY — DX: Tobacco use: Z72.0

## 2023-09-12 HISTORY — DX: Acute sinusitis, unspecified: J01.90

## 2023-09-12 NOTE — Telephone Encounter (Signed)
 Jeanette Simpson called to report that pt is very apprehensive about the bronchoscopy tomorrow. I was wondering if there was anyway [name] could call & speak with her?

## 2023-09-12 NOTE — Patient Instructions (Addendum)
 Your procedure is scheduled nw:Ulzdijb 09/13/23 Report to the Registration Desk on the 1st floor of the [facility]. To find out your arrival time, please call [phone] between 1PM - 3PM on: Monday 09/12/23 If your arrival time is 6:00 am, do not arrive before that time as the [facility] entrance doors do not open until 6:00 am.  REMEMBER: Instructions that are not followed completely may result in serious medical risk, up to and including death; or upon the discretion of your surgeon and anesthesiologist your surgery may need to be rescheduled.  Do not eat food after midnight the night before surgery.  No gum chewing or hard candies.   One week prior to surgery: Stop Anti-inflammatories (NSAIDS) such as Advil, Aleve, Ibuprofen, Motrin, Naproxen, Naprosyn and Aspirin based products such as Excedrin, Goody's Powder, BC Powder. Stop ANY OVER THE COUNTER supplements until after surgery.  You may however, continue to take Tylenol if needed for pain up until the day of surgery. Stop metFORMIN (GLUCOPHAGE) 500 MG 2 days before your procedure.   Continue taking all of your other prescription medications up until the day of surgery.  ON THE DAY OF SURGERY ONLY TAKE THESE MEDICATIONS WITH SIPS OF WATER:  DULoxetine (CYMBALTA) 60 MG  gabapentin (NEURONTIN) 400 MG   Use inhalers on the day of surgery and bring to the hospital. budesonide -glycopyrrolate-formoterol (BREZTRI AEROSPHERE) 160-9-4.8 MCG/ACT AERO inhaler  ipratropium-albuterol (DUONEB) 0.5-2.5 (3) MG/3ML SOLN    No Alcohol for 24 hours before or after surgery.  No Smoking including e-cigarettes for 24 hours before surgery.  No chewable tobacco products for at least 6 hours before surgery.  No nicotine patches on the day of surgery.  Do not use any recreational drugs for at least a week (preferably 2 weeks) before your surgery.  Please be advised that the combination of cocaine and anesthesia may have negative outcomes,  up to and including death. If you test positive for cocaine, your surgery will be cancelled.  On the morning of surgery brush your teeth with toothpaste and water, you may rinse your mouth with mouthwash if you wish. Do not swallow any toothpaste or mouthwash.  Use CHG Soap or wipes as directed on instruction sheet.  Do not wear jewelry, make-up, hairpins, clips or nail polish.  For welded (permanent) jewelry: bracelets, anklets, waist bands, etc.  Please have this removed prior to surgery.  If it is not removed, there is a chance that hospital personnel will need to cut it off on the day of surgery.  Do not wear lotions, powders, or perfumes.   Do not shave body hair from the neck down 48 hours before surgery.  Contact lenses, hearing aids and dentures may not be worn into surgery.  Do not bring valuables to the hospital. [facility] is not responsible for any missing/lost belongings or valuables.   Notify your doctor if there is any change in your medical condition (cold, fever, infection).  Wear comfortable clothing (specific to your surgery type) to the hospital.  After surgery, you can help prevent lung complications by doing breathing exercises.  Take deep breaths and cough every 1-2 hours. Your doctor may order a device called an Incentive Spirometer to help you take deep breaths. When coughing or sneezing, hold a pillow firmly against your incision with both hands. This is called "splinting." Doing this helps protect your incision. It also decreases belly discomfort.  If you are being admitted to the hospital overnight, leave your suitcase in  the car. After surgery it may be brought to your room.  In case of increased patient census, it may be necessary for you, the patient, to continue your postoperative care in the Same Day Surgery department.  If you are being discharged the day of surgery, you will not be allowed to drive home. You will need a responsible individual to  drive you home and stay with you for 24 hours after surgery.   If you are taking public transportation, you will need to have a responsible individual with you.  Please call the Pre-admissions Testing Dept. at [phone] if you have any questions about these instructions.  Surgery Visitation Policy:  Patients having surgery or a procedure may have two visitors.  Children under the age of 83 must have an adult with them who is not the patient.  Inpatient Visitation:    Visiting hours are 7 a.m. to 8 p.m. Up to four visitors are allowed at one time in a patient room. The visitors may rotate out with other people during the day.  One visitor age 65 or older may stay with the patient overnight and must be in the room by 8 p.m.   Merchandiser, retail to address health-related social needs:  [email or web address]

## 2023-09-12 NOTE — Telephone Encounter (Signed)
 Copied from CRM #[id number]. Topic: Appointments - Scheduling Inquiry for Clinic >> Sep 12, 2023  2:57 PM Jeanette Simpson wrote: Reason for CRM: PT CALLED ADVISED SHE DOES NOT WANT TO HAVE THE PROCEDURE THAT IS SCHEDULED FOR TOMORROW AND STATED SHE DOESN'T WANT IT ALL

## 2023-09-12 NOTE — Telephone Encounter (Signed)
 I spoke with the patient. She said she just feels uncomfortable about having the procedure and her son in-law is going out of town for a few days. I did offer to schedule her an appt with Dr. Isadora to go over the procedure in the office. She said she did not have a ride and did not want to schedule an appt. I let her  know we are happy to see her in the office or reschedule the procedure at anytime.  I have canceled the procedure.  Nothing further needed.   Dr. Isadora- Just an FYI.

## 2023-09-13 ENCOUNTER — Encounter: Payer: Self-pay | Admitting: Urgent Care

## 2023-09-13 ENCOUNTER — Telehealth: Payer: Self-pay | Admitting: Hematology and Oncology

## 2023-09-13 ENCOUNTER — Encounter: Admission: RE | Payer: Self-pay | Source: Home / Self Care

## 2023-09-13 ENCOUNTER — Ambulatory Visit: Attending: Student in an Organized Health Care Education/Training Program

## 2023-09-13 ENCOUNTER — Ambulatory Visit
Admission: RE | Admit: 2023-09-13 | Source: Home / Self Care | Admitting: Student in an Organized Health Care Education/Training Program

## 2023-09-13 DIAGNOSIS — R918 Other nonspecific abnormal finding of lung field: Secondary | ICD-10-CM | POA: Insufficient documentation

## 2023-09-13 SURGERY — VIDEO BRONCHOSCOPY WITH ENDOBRONCHIAL NAVIGATION
Anesthesia: General | Laterality: Bilateral

## 2023-09-13 NOTE — Telephone Encounter (Signed)
 an we schedule her an appointment to see me to discuss palliative care? It is not urgent and we can work around her son-in-law's schedule, so it might be best to call him, Redell.  09/13/23 Spoke with Redell and confirmed appt.

## 2023-09-13 NOTE — Addendum Note (Signed)
 Addended by: HARL SETTER on: 09/13/2023 10:33 AM   Modules accepted: Orders

## 2023-09-14 DIAGNOSIS — J329 Chronic sinusitis, unspecified: Secondary | ICD-10-CM | POA: Diagnosis not present

## 2023-09-14 DIAGNOSIS — J440 Chronic obstructive pulmonary disease with acute lower respiratory infection: Secondary | ICD-10-CM | POA: Diagnosis not present

## 2023-09-14 DIAGNOSIS — I8393 Asymptomatic varicose veins of bilateral lower extremities: Secondary | ICD-10-CM | POA: Diagnosis not present

## 2023-09-14 DIAGNOSIS — Z7984 Long term (current) use of oral hypoglycemic drugs: Secondary | ICD-10-CM | POA: Diagnosis not present

## 2023-09-14 DIAGNOSIS — Z556 Problems related to health literacy: Secondary | ICD-10-CM | POA: Diagnosis not present

## 2023-09-14 DIAGNOSIS — Z604 Social exclusion and rejection: Secondary | ICD-10-CM | POA: Diagnosis not present

## 2023-09-14 DIAGNOSIS — E1142 Type 2 diabetes mellitus with diabetic polyneuropathy: Secondary | ICD-10-CM | POA: Diagnosis not present

## 2023-09-14 DIAGNOSIS — Z79891 Long term (current) use of opiate analgesic: Secondary | ICD-10-CM | POA: Diagnosis not present

## 2023-09-14 DIAGNOSIS — J441 Chronic obstructive pulmonary disease with (acute) exacerbation: Secondary | ICD-10-CM | POA: Diagnosis not present

## 2023-09-14 DIAGNOSIS — I509 Heart failure, unspecified: Secondary | ICD-10-CM | POA: Diagnosis not present

## 2023-09-14 DIAGNOSIS — F17210 Nicotine dependence, cigarettes, uncomplicated: Secondary | ICD-10-CM | POA: Diagnosis not present

## 2023-09-15 DIAGNOSIS — J441 Chronic obstructive pulmonary disease with (acute) exacerbation: Secondary | ICD-10-CM | POA: Diagnosis not present

## 2023-09-15 DIAGNOSIS — Z7984 Long term (current) use of oral hypoglycemic drugs: Secondary | ICD-10-CM | POA: Diagnosis not present

## 2023-09-15 DIAGNOSIS — J329 Chronic sinusitis, unspecified: Secondary | ICD-10-CM | POA: Diagnosis not present

## 2023-09-15 DIAGNOSIS — J440 Chronic obstructive pulmonary disease with acute lower respiratory infection: Secondary | ICD-10-CM | POA: Diagnosis not present

## 2023-09-15 DIAGNOSIS — I509 Heart failure, unspecified: Secondary | ICD-10-CM | POA: Diagnosis not present

## 2023-09-15 DIAGNOSIS — F17210 Nicotine dependence, cigarettes, uncomplicated: Secondary | ICD-10-CM | POA: Diagnosis not present

## 2023-09-15 DIAGNOSIS — E1142 Type 2 diabetes mellitus with diabetic polyneuropathy: Secondary | ICD-10-CM | POA: Diagnosis not present

## 2023-09-15 DIAGNOSIS — Z79891 Long term (current) use of opiate analgesic: Secondary | ICD-10-CM | POA: Diagnosis not present

## 2023-09-15 DIAGNOSIS — Z556 Problems related to health literacy: Secondary | ICD-10-CM | POA: Diagnosis not present

## 2023-09-15 DIAGNOSIS — Z604 Social exclusion and rejection: Secondary | ICD-10-CM | POA: Diagnosis not present

## 2023-09-15 DIAGNOSIS — I8393 Asymptomatic varicose veins of bilateral lower extremities: Secondary | ICD-10-CM | POA: Diagnosis not present

## 2023-09-16 ENCOUNTER — Encounter (HOSPITAL_COMMUNITY): Payer: Self-pay

## 2023-09-16 ENCOUNTER — Encounter (HOSPITAL_COMMUNITY)

## 2023-09-16 DIAGNOSIS — J440 Chronic obstructive pulmonary disease with acute lower respiratory infection: Secondary | ICD-10-CM | POA: Diagnosis not present

## 2023-09-16 DIAGNOSIS — I8393 Asymptomatic varicose veins of bilateral lower extremities: Secondary | ICD-10-CM | POA: Diagnosis not present

## 2023-09-16 DIAGNOSIS — Z604 Social exclusion and rejection: Secondary | ICD-10-CM | POA: Diagnosis not present

## 2023-09-16 DIAGNOSIS — Z7984 Long term (current) use of oral hypoglycemic drugs: Secondary | ICD-10-CM | POA: Diagnosis not present

## 2023-09-16 DIAGNOSIS — Z79891 Long term (current) use of opiate analgesic: Secondary | ICD-10-CM | POA: Diagnosis not present

## 2023-09-16 DIAGNOSIS — F17210 Nicotine dependence, cigarettes, uncomplicated: Secondary | ICD-10-CM | POA: Diagnosis not present

## 2023-09-16 DIAGNOSIS — E1142 Type 2 diabetes mellitus with diabetic polyneuropathy: Secondary | ICD-10-CM | POA: Diagnosis not present

## 2023-09-16 DIAGNOSIS — Z556 Problems related to health literacy: Secondary | ICD-10-CM | POA: Diagnosis not present

## 2023-09-16 DIAGNOSIS — J329 Chronic sinusitis, unspecified: Secondary | ICD-10-CM | POA: Diagnosis not present

## 2023-09-16 DIAGNOSIS — J441 Chronic obstructive pulmonary disease with (acute) exacerbation: Secondary | ICD-10-CM | POA: Diagnosis not present

## 2023-09-16 DIAGNOSIS — I509 Heart failure, unspecified: Secondary | ICD-10-CM | POA: Diagnosis not present

## 2023-09-20 ENCOUNTER — Inpatient Hospital Stay: Admitting: Hematology and Oncology

## 2023-09-20 ENCOUNTER — Other Ambulatory Visit

## 2023-09-20 NOTE — Progress Notes (Deleted)
 [facility] [address] [place],  [place]  [address] [phone]  Clinic Day:  09/20/2023   Referring physician: Benjamine Lauraine DASEN, NP  Patient Care Team: Patient Care Team: Benjamine Lauraine DASEN, NP as PCP - General (Nurse Practitioner)   REASON FOR CONSULTATION:  Lung Mass  HISTORY OF PRESENT ILLNESS:   Jeanette Simpson is a 83 y.o. female with a history of lung mass who is referred in consultation by Lauraine Benjamine, NP for assessment and management. She presented to her PCP with complaints of upper respiratory symptoms including cough, dyspnea, runny nose. The cough is wheezy and productive. She also noted postnasal drainage and hoarseness. She was sent for CXR and then CT of the chest 08/14 which revealed an enlarged subcarinal lymph node measuring 2.1 x 3.0, an enlarged AP window lymph node measuring 1.3 x 1.7 cm; a mass extending from the left hilum to the left lung periphery, with encasement of the left main pulmonary artery and pulmonary veins, and involvement of the pericardium, overall measuring approximately 9.3 cm x 8.3 cm x 7.8 cm; there is invasion of the pericardium and heart; there is a separate mass in the left upper lobe, medial aspect, measuring 2.3 cm x 1.5 cm; bilateral adrenal masses, measuring 3.6 cm x 2.0 cm on the right and 3.5 cm x 2.3 cm on the left. She also notes a weight loss of over 20 pounds recently. She states her appetite is fair and she blamed the weight loss on losing her daughter to pancreatic cancer last year. She notes coughing up a small amount of blood yesterday. 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 She has ongoing cough and shortness of breath due to COPD, but states these have both worsened. She denies fever, chills, nausea or vomiting. She denies chest pain. She is an everyday smoker and denies the use of alcohol. She lives alone with some assistance from home health for ADLs. Her son-in-law is her main caretaker.     REVIEW OF SYSTEMS:   Review of Systems  Constitutional:  Positive for appetite change, fatigue and unexpected weight change.  HENT:   Positive for voice change.   Respiratory:  Positive for cough, hemoptysis, shortness of breath and wheezing.   Cardiovascular: Negative.   Gastrointestinal:  Positive for nausea.  Genitourinary:  Positive for bladder incontinence.   Neurological:  Positive for extremity weakness.  Psychiatric/Behavioral: Negative.       VITALS:   There were no vitals taken for this visit.  Wt Readings from Last 3 Encounters:  09/12/23 160 lb (72.6 kg)  09/08/23 169 lb 6.4 oz (76.8 kg)  12/31/17 195 lb (88.5 kg)    There is no height or weight on file to calculate BMI.  Performance status (ECOG): 1 - Symptomatic but completely ambulatory  PHYSICAL EXAM:   Physical Exam Constitutional:      Appearance: Normal appearance. She is normal weight.  HENT:     Head: Normocephalic and atraumatic.     Mouth/Throat:     Mouth: Mucous membranes are moist.  Cardiovascular:     Rate and Rhythm: Normal rate and regular rhythm.     Pulses: Normal pulses.     Heart sounds: Normal heart sounds.  Pulmonary:     Effort: Pulmonary effort is normal.     Breath sounds: Wheezing present.  Abdominal:     Palpations: Abdomen is soft.  Musculoskeletal:  General: Normal range of motion.  Skin:    General: Skin is warm and dry.  Neurological:     General: No focal deficit present.     Mental Status: She is alert and oriented to person, place, and time. Mental status is at baseline.  Psychiatric:        Mood and Affect: Mood normal.        Behavior: Behavior normal.        Thought Content: Thought content normal.        Judgment: Judgment normal.      LABS:      Latest Ref Rng & Units 09/08/2023    1:17 PM 12/31/2017    1:47 PM  CBC  WBC 4.0 - 10.5 K/uL 10.4  14.0   Hemoglobin 12.0 - 15.0 g/dL 86.0  86.0   Hematocrit 36.0 - 46.0 % 44.8  46.4   Platelets  150 - 400 K/uL 408  322       Latest Ref Rng & Units 09/08/2023    1:17 PM 12/31/2017    1:47 PM  CMP  Glucose 70 - 99 mg/dL 838  881   BUN 8 - 23 mg/dL 10  10   Creatinine 9.55 - 1.00 mg/dL 9.37  9.37   Sodium 864 - 145 mmol/L 137  137   Potassium 3.5 - 5.1 mmol/L 3.9  4.1   Chloride 98 - 111 mmol/L 101  102   CO2 22 - 32 mmol/L 25  23   Calcium 8.9 - 10.3 mg/dL 9.3  9.2   Total Protein 6.5 - 8.1 g/dL 7.0  6.7   Total Bilirubin 0.0 - 1.2 mg/dL 0.3  0.4   Alkaline Phos 38 - 126 U/L 80  74   AST 15 - 41 U/L 23  21   ALT 0 - 44 U/L 8  19      No results found for: CEA1, CEA / No results found for: CEA1, CEA No results found for: PSA1 No results found for: CAN199 No results found for: CAN125  No results found for: TOTALPROTELP, ALBUMINELP, A1GS, A2GS, BETS, BETA2SER, GAMS, MSPIKE, SPEI No results found for: TIBC, FERRITIN, IRONPCTSAT Lab Results  Component Value Date   LDH 199 (H) 09/08/2023    STUDIES:   No results found.    HISTORY:   Past Medical History:  Diagnosis Date   Acute sinusitis, unspecified    Allergic rhinitis    CHF (congestive heart failure) (HCC)    Chronic obstructive pulmonary disease, unspecified (HCC)    COPD (chronic obstructive pulmonary disease) (HCC)    Dorsopathy, unspecified    Dyspnea    Lung mass    Mixed hyperlipidemia    Obesity    PVD (peripheral vascular disease) (HCC)    Tobacco use    Type 2 diabetes mellitus without complications (HCC)    Unilateral primary osteoarthritis, right knee       Family History  Problem Relation Age of Onset   Heart attack Mother    Cancer Father        Gallbladder   Pancreatic cancer Daughter     Social History:  reports that she has been smoking cigarettes. She has a 30 pack-year smoking history. She has never used smokeless tobacco. She reports that she does not currently use drugs after having used the following drugs: Marijuana. She reports that  she does not drink alcohol.The patient is accompanied by son-in-law today.  Allergies:  Allergies  Allergen Reactions  Penicillins Hives and Swelling   Codeine Nausea Only   Sulfa Antibiotics Nausea And Vomiting    Current Medications: Current Outpatient Medications  Medication Sig Dispense Refill   budesonide -glycopyrrolate-formoterol (BREZTRI AEROSPHERE) 160-9-4.8 MCG/ACT AERO inhaler Inhale 2 puffs into the lungs 2 (two) times daily.     DULoxetine (CYMBALTA) 60 MG capsule      fluticasone (FLONASE) 50 MCG/ACT nasal spray Place 1 spray into both nostrils in the morning and at bedtime.     furosemide  (LASIX ) 20 MG tablet Take 1 tablet (20 mg total) by mouth daily for 10 days. (Patient not taking: Reported on 09/08/2023) 10 tablet 0   gabapentin (NEURONTIN) 400 MG tablet Take 400 mg by mouth 2 (two) times daily.     ipratropium-albuterol (DUONEB) 0.5-2.5 (3) MG/3ML SOLN      metFORMIN (GLUCOPHAGE) 500 MG tablet Take 500 mg by mouth 2 (two) times daily with a meal.     pravastatin (PRAVACHOL) 40 MG tablet Take 40 mg by mouth daily.     traMADol (ULTRAM) 50 MG tablet Take 50 mg by mouth.     No current facility-administered medications for this visit.     ASSESSMENT & PLAN:   Assessment:  Jeanette Simpson is a 83 y.o. female with history of lung masses, enlarged lymph nodes and bilateral adrenal masses. I discussed with the patient and her son-in-law the concern for malignancy. I spoke to the patient about options from pursuing diagnosis to palliative care and she states she wants everything done. I explained that this will include imaging as well as tissue sampling. I will contact Pulmonology to review her CT images as her mass is largely involved with the main vessels in the chest. Hopefully, they will be able to pursue bronchoscopy with biopsy safely; if not we will pursue biopsy of adrenal glands.  We will plan for PET scan ASAP.   Plan: 1.  PET scan imaging; tissue biopsy. Return  to clinic once pathology obtained to discuss treatment planning.   I discussed the assessment and treatment plan with the patient.  The patient was provided an opportunity to ask questions and all were answered.  The patient agreed with the plan and demonstrated an understanding of the instructions.    Thank you for the referral    45 minutes was spent in patient care.  This included time spent preparing to see the patient (e.g., review of tests), obtaining and/or reviewing separately obtained history, counseling and educating the patient/family/caregiver, ordering medications, tests, or procedures; documenting clinical information in the electronic or other health record, independently interpreting results and communicating results to the patient/family/caregiver as well as coordination of care.      Eleanor DELENA Bach, NP   Family Nurse Practitioner - Board Certified [facility] [place] [phone]

## 2023-09-21 ENCOUNTER — Telehealth: Payer: Self-pay

## 2023-09-21 NOTE — Telephone Encounter (Signed)
 Jeanette Simpson states they are unable to see the last 3 office notes. The Hospice providers are requesting they be faxed to [phone].

## 2023-09-22 ENCOUNTER — Inpatient Hospital Stay: Admitting: Oncology

## 2023-09-22 ENCOUNTER — Inpatient Hospital Stay

## 2023-10-19 DEATH — deceased

## 2023-12-07 ENCOUNTER — Encounter: Payer: Self-pay | Admitting: Oncology
